# Patient Record
Sex: Female | Born: 1965
Health system: Southern US, Community
[De-identification: ages and names within clinical notes are randomized; demographics above are authoritative.]

## PROBLEM LIST (undated history)

## (undated) DIAGNOSIS — F419 Anxiety disorder, unspecified: Secondary | ICD-10-CM

## (undated) DIAGNOSIS — K219 Gastro-esophageal reflux disease without esophagitis: Secondary | ICD-10-CM

## (undated) DIAGNOSIS — J45909 Unspecified asthma, uncomplicated: Secondary | ICD-10-CM

## (undated) DIAGNOSIS — I341 Nonrheumatic mitral (valve) prolapse: Secondary | ICD-10-CM

## (undated) DIAGNOSIS — T7840XA Allergy, unspecified, initial encounter: Secondary | ICD-10-CM

## (undated) DIAGNOSIS — R011 Cardiac murmur, unspecified: Secondary | ICD-10-CM

## (undated) DIAGNOSIS — E079 Disorder of thyroid, unspecified: Secondary | ICD-10-CM

## (undated) DIAGNOSIS — I340 Nonrheumatic mitral (valve) insufficiency: Secondary | ICD-10-CM

## (undated) DIAGNOSIS — F32A Depression, unspecified: Secondary | ICD-10-CM

## (undated) DIAGNOSIS — I10 Essential (primary) hypertension: Secondary | ICD-10-CM

## (undated) DIAGNOSIS — M797 Fibromyalgia: Secondary | ICD-10-CM

## (undated) DIAGNOSIS — M1990 Unspecified osteoarthritis, unspecified site: Secondary | ICD-10-CM

## (undated) DIAGNOSIS — F329 Major depressive disorder, single episode, unspecified: Secondary | ICD-10-CM

## (undated) DIAGNOSIS — E785 Hyperlipidemia, unspecified: Secondary | ICD-10-CM

## (undated) DIAGNOSIS — I351 Nonrheumatic aortic (valve) insufficiency: Secondary | ICD-10-CM

## (undated) DIAGNOSIS — G4730 Sleep apnea, unspecified: Secondary | ICD-10-CM

## (undated) HISTORY — DX: Essential (primary) hypertension: I10

## (undated) HISTORY — PX: TUBAL LIGATION: SHX77

## (undated) HISTORY — DX: Gastro-esophageal reflux disease without esophagitis: K21.9

## (undated) HISTORY — DX: Major depressive disorder, single episode, unspecified: F32.9

## (undated) HISTORY — DX: Unspecified asthma, uncomplicated: J45.909

## (undated) HISTORY — DX: Depression, unspecified: F32.A

## (undated) HISTORY — PX: TRANSESOPHAGEAL ECHOCARDIOGRAM: SHX273

## (undated) HISTORY — DX: Nonrheumatic aortic (valve) insufficiency: I35.1

## (undated) HISTORY — DX: Disorder of thyroid, unspecified: E07.9

## (undated) HISTORY — DX: Allergy, unspecified, initial encounter: T78.40XA

## (undated) HISTORY — DX: Hyperlipidemia, unspecified: E78.5

## (undated) HISTORY — DX: Unspecified osteoarthritis, unspecified site: M19.90

## (undated) HISTORY — DX: Nonrheumatic mitral (valve) insufficiency: I34.0

## (undated) HISTORY — DX: Fibromyalgia: M79.7

## (undated) HISTORY — DX: Cardiac murmur, unspecified: R01.1

## (undated) HISTORY — PX: CHOLECYSTECTOMY: SHX55

## (undated) HISTORY — DX: Nonrheumatic mitral (valve) prolapse: I34.1

## (undated) HISTORY — DX: Sleep apnea, unspecified: G47.30

## (undated) HISTORY — DX: Anxiety disorder, unspecified: F41.9

---

## 1997-12-23 ENCOUNTER — Ambulatory Visit (HOSPITAL_COMMUNITY): Admission: RE | Admit: 1997-12-23 | Discharge: 1997-12-23 | Payer: Self-pay | Admitting: Otolaryngology

## 1998-01-06 ENCOUNTER — Inpatient Hospital Stay (HOSPITAL_COMMUNITY): Admission: EM | Admit: 1998-01-06 | Discharge: 1998-01-08 | Payer: Self-pay | Admitting: Internal Medicine

## 1998-01-07 ENCOUNTER — Encounter: Payer: Self-pay | Admitting: Internal Medicine

## 1998-02-09 ENCOUNTER — Other Ambulatory Visit: Admission: RE | Admit: 1998-02-09 | Discharge: 1998-02-09 | Payer: Self-pay | Admitting: Internal Medicine

## 1998-06-18 ENCOUNTER — Encounter: Payer: Self-pay | Admitting: Emergency Medicine

## 1998-06-18 ENCOUNTER — Emergency Department (HOSPITAL_COMMUNITY): Admission: EM | Admit: 1998-06-18 | Discharge: 1998-06-18 | Payer: Self-pay | Admitting: Emergency Medicine

## 1998-08-24 ENCOUNTER — Encounter: Admission: RE | Admit: 1998-08-24 | Discharge: 1998-09-02 | Payer: Self-pay | Admitting: Internal Medicine

## 1999-05-20 ENCOUNTER — Ambulatory Visit (HOSPITAL_COMMUNITY): Admission: RE | Admit: 1999-05-20 | Discharge: 1999-05-20 | Payer: Self-pay | Admitting: Neurosurgery

## 2000-04-04 ENCOUNTER — Other Ambulatory Visit: Admission: RE | Admit: 2000-04-04 | Discharge: 2000-04-04 | Payer: Self-pay | Admitting: Internal Medicine

## 2000-11-05 ENCOUNTER — Encounter: Payer: Self-pay | Admitting: Internal Medicine

## 2000-11-05 ENCOUNTER — Emergency Department: Admission: EM | Admit: 2000-11-05 | Discharge: 2000-11-05 | Payer: Self-pay | Admitting: Emergency Medicine

## 2001-05-03 ENCOUNTER — Other Ambulatory Visit: Admission: RE | Admit: 2001-05-03 | Discharge: 2001-05-03 | Payer: Self-pay | Admitting: Internal Medicine

## 2001-06-14 ENCOUNTER — Ambulatory Visit (HOSPITAL_COMMUNITY): Admission: RE | Admit: 2001-06-14 | Discharge: 2001-06-14 | Payer: Self-pay | Admitting: Family Medicine

## 2001-06-14 ENCOUNTER — Encounter: Payer: Self-pay | Admitting: Family Medicine

## 2001-06-26 ENCOUNTER — Encounter: Admission: RE | Admit: 2001-06-26 | Discharge: 2001-07-09 | Payer: Self-pay | Admitting: Family Medicine

## 2002-04-18 ENCOUNTER — Encounter: Payer: Self-pay | Admitting: Neurosurgery

## 2002-04-18 ENCOUNTER — Ambulatory Visit (HOSPITAL_COMMUNITY): Admission: RE | Admit: 2002-04-18 | Discharge: 2002-04-18 | Payer: Self-pay | Admitting: Neurosurgery

## 2002-07-23 ENCOUNTER — Encounter: Payer: Self-pay | Admitting: Neurosurgery

## 2002-07-23 ENCOUNTER — Encounter: Payer: Self-pay | Admitting: Radiology

## 2002-07-23 ENCOUNTER — Encounter: Admission: RE | Admit: 2002-07-23 | Discharge: 2002-07-23 | Payer: Self-pay | Admitting: Neurosurgery

## 2003-07-07 ENCOUNTER — Ambulatory Visit (HOSPITAL_COMMUNITY): Admission: RE | Admit: 2003-07-07 | Discharge: 2003-07-07 | Payer: Self-pay | Admitting: Urology

## 2003-07-07 ENCOUNTER — Ambulatory Visit (HOSPITAL_BASED_OUTPATIENT_CLINIC_OR_DEPARTMENT_OTHER): Admission: RE | Admit: 2003-07-07 | Discharge: 2003-07-07 | Payer: Self-pay | Admitting: Urology

## 2003-07-31 ENCOUNTER — Ambulatory Visit (HOSPITAL_BASED_OUTPATIENT_CLINIC_OR_DEPARTMENT_OTHER): Admission: RE | Admit: 2003-07-31 | Discharge: 2003-07-31 | Payer: Self-pay | Admitting: Internal Medicine

## 2003-07-31 ENCOUNTER — Encounter: Payer: Self-pay | Admitting: Internal Medicine

## 2004-05-05 ENCOUNTER — Emergency Department (HOSPITAL_COMMUNITY): Admission: EM | Admit: 2004-05-05 | Discharge: 2004-05-05 | Payer: Self-pay | Admitting: Family Medicine

## 2004-08-16 LAB — CONVERTED CEMR LAB: Pap Smear: NORMAL

## 2005-06-12 ENCOUNTER — Ambulatory Visit (HOSPITAL_COMMUNITY): Admission: RE | Admit: 2005-06-12 | Discharge: 2005-06-12 | Payer: Self-pay | Admitting: Family Medicine

## 2005-06-12 ENCOUNTER — Emergency Department (HOSPITAL_COMMUNITY): Admission: EM | Admit: 2005-06-12 | Discharge: 2005-06-12 | Payer: Self-pay | Admitting: Family Medicine

## 2005-08-14 ENCOUNTER — Emergency Department (HOSPITAL_COMMUNITY): Admission: EM | Admit: 2005-08-14 | Discharge: 2005-08-14 | Payer: Self-pay | Admitting: Emergency Medicine

## 2005-12-30 ENCOUNTER — Ambulatory Visit (HOSPITAL_COMMUNITY): Admission: RE | Admit: 2005-12-30 | Discharge: 2005-12-30 | Payer: Self-pay | Admitting: Family Medicine

## 2006-01-30 ENCOUNTER — Encounter (INDEPENDENT_AMBULATORY_CARE_PROVIDER_SITE_OTHER): Payer: Self-pay | Admitting: Specialist

## 2006-01-31 ENCOUNTER — Inpatient Hospital Stay (HOSPITAL_COMMUNITY): Admission: RE | Admit: 2006-01-31 | Discharge: 2006-01-31 | Payer: Self-pay | Admitting: General Surgery

## 2006-05-12 ENCOUNTER — Ambulatory Visit: Payer: Self-pay | Admitting: *Deleted

## 2006-05-12 ENCOUNTER — Encounter (INDEPENDENT_AMBULATORY_CARE_PROVIDER_SITE_OTHER): Payer: Self-pay | Admitting: *Deleted

## 2006-05-12 ENCOUNTER — Ambulatory Visit (HOSPITAL_COMMUNITY): Admission: RE | Admit: 2006-05-12 | Discharge: 2006-05-12 | Payer: Self-pay | Admitting: Family Medicine

## 2007-03-24 ENCOUNTER — Emergency Department (HOSPITAL_COMMUNITY): Admission: EM | Admit: 2007-03-24 | Discharge: 2007-03-24 | Payer: Self-pay | Admitting: Family Medicine

## 2007-09-07 ENCOUNTER — Ambulatory Visit: Payer: Self-pay | Admitting: Internal Medicine

## 2007-09-07 DIAGNOSIS — R946 Abnormal results of thyroid function studies: Secondary | ICD-10-CM | POA: Insufficient documentation

## 2007-09-07 DIAGNOSIS — F3289 Other specified depressive episodes: Secondary | ICD-10-CM | POA: Insufficient documentation

## 2007-09-07 DIAGNOSIS — G43909 Migraine, unspecified, not intractable, without status migrainosus: Secondary | ICD-10-CM | POA: Insufficient documentation

## 2007-09-07 DIAGNOSIS — IMO0001 Reserved for inherently not codable concepts without codable children: Secondary | ICD-10-CM | POA: Insufficient documentation

## 2007-09-07 DIAGNOSIS — F329 Major depressive disorder, single episode, unspecified: Secondary | ICD-10-CM | POA: Insufficient documentation

## 2007-09-07 DIAGNOSIS — Z87442 Personal history of urinary calculi: Secondary | ICD-10-CM | POA: Insufficient documentation

## 2007-09-07 DIAGNOSIS — Q054 Unspecified spina bifida with hydrocephalus: Secondary | ICD-10-CM | POA: Insufficient documentation

## 2007-09-07 DIAGNOSIS — J45909 Unspecified asthma, uncomplicated: Secondary | ICD-10-CM | POA: Insufficient documentation

## 2007-09-07 DIAGNOSIS — I059 Rheumatic mitral valve disease, unspecified: Secondary | ICD-10-CM | POA: Insufficient documentation

## 2007-09-07 DIAGNOSIS — F988 Other specified behavioral and emotional disorders with onset usually occurring in childhood and adolescence: Secondary | ICD-10-CM | POA: Insufficient documentation

## 2007-09-07 DIAGNOSIS — J309 Allergic rhinitis, unspecified: Secondary | ICD-10-CM | POA: Insufficient documentation

## 2007-09-07 LAB — CONVERTED CEMR LAB
ALT: 12 units/L (ref 0–35)
AST: 17 units/L (ref 0–37)
Albumin: 4.5 g/dL (ref 3.5–5.2)
Alkaline Phosphatase: 61 units/L (ref 39–117)
BUN: 10 mg/dL (ref 6–23)
Bilirubin, Direct: 0.1 mg/dL (ref 0.0–0.3)
CO2: 23 meq/L (ref 19–32)
Calcium: 8.9 mg/dL (ref 8.4–10.5)
Chloride: 103 meq/L (ref 96–112)
Cholesterol: 186 mg/dL (ref 0–200)
Creatinine, Ser: 0.84 mg/dL (ref 0.40–1.20)
Glucose, Bld: 81 mg/dL (ref 70–99)
HDL: 72 mg/dL (ref >39)
Indirect Bilirubin: 0.4 mg/dL (ref 0.0–0.9)
LDL Cholesterol: 98 mg/dL (ref 0–99)
Potassium: 3.7 meq/L (ref 3.5–5.3)
Sodium: 139 meq/L (ref 135–145)
T4, Total: 8.8 ug/dL (ref 5.0–12.5)
TSH: 2.956 microintl units/mL (ref 0.350–4.50)
Thyroglobulin Ab: <30 (ref 0.0–60.0)
Total Bilirubin: 0.5 mg/dL (ref 0.3–1.2)
Total Protein: 6.9 g/dL (ref 6.0–8.3)
Triglycerides: 80 mg/dL (ref <150)
VLDL: 16 mg/dL (ref 0–40)

## 2007-09-18 ENCOUNTER — Encounter: Payer: Self-pay | Admitting: Internal Medicine

## 2007-09-26 ENCOUNTER — Ambulatory Visit: Payer: Self-pay | Admitting: Internal Medicine

## 2007-09-26 ENCOUNTER — Telehealth: Payer: Self-pay | Admitting: Internal Medicine

## 2007-09-26 LAB — CONVERTED CEMR LAB
Thyroglobulin Ab: 33.8 (ref 0.0–60.0)
Thyroperoxidase Ab SerPl-aCnc: <25 (ref 0.0–60.0)

## 2007-09-27 ENCOUNTER — Telehealth: Payer: Self-pay | Admitting: Internal Medicine

## 2007-10-19 ENCOUNTER — Ambulatory Visit: Payer: Self-pay | Admitting: Internal Medicine

## 2007-10-19 DIAGNOSIS — M546 Pain in thoracic spine: Secondary | ICD-10-CM | POA: Insufficient documentation

## 2007-12-17 ENCOUNTER — Ambulatory Visit: Payer: Self-pay | Admitting: Internal Medicine

## 2007-12-17 DIAGNOSIS — H9209 Otalgia, unspecified ear: Secondary | ICD-10-CM | POA: Insufficient documentation

## 2007-12-17 DIAGNOSIS — R5381 Other malaise: Secondary | ICD-10-CM | POA: Insufficient documentation

## 2007-12-17 DIAGNOSIS — R5383 Other fatigue: Secondary | ICD-10-CM

## 2007-12-17 LAB — CONVERTED CEMR LAB
Basophils Absolute: 0.2 10*3/uL — ABNORMAL HIGH (ref 0.0–0.1)
Basophils Relative: 3.7 % — ABNORMAL HIGH (ref 0.0–3.0)
Eosinophils Absolute: 0.2 10*3/uL (ref 0.0–0.7)
Eosinophils Relative: 3.4 % (ref 0.0–5.0)
Ferritin: 6.3 ng/mL — ABNORMAL LOW (ref 10.0–291.0)
HCT: 35.9 % — ABNORMAL LOW (ref 36.0–46.0)
Hemoglobin: 12.4 g/dL (ref 12.0–15.0)
Iron: 45 ug/dL (ref 42–145)
Lymphocytes Relative: 47.8 % — ABNORMAL HIGH (ref 12.0–46.0)
MCHC: 34.5 g/dL (ref 30.0–36.0)
MCV: 99.6 fL (ref 78.0–100.0)
Monocytes Absolute: 0.3 10*3/uL (ref 0.1–1.0)
Monocytes Relative: 6.5 % (ref 3.0–12.0)
Neutro Abs: 2 10*3/uL (ref 1.4–7.7)
Neutrophils Relative %: 38.6 % — ABNORMAL LOW (ref 43.0–77.0)
Platelets: 220 10*3/uL (ref 150–400)
RBC: 3.6 M/uL — ABNORMAL LOW (ref 3.87–5.11)
RDW: 12.2 % (ref 11.5–14.6)
Saturation Ratios: 12.3 % — ABNORMAL LOW (ref 20.0–50.0)
Transferrin: 261.3 mg/dL (ref >=212.0)
WBC: 5.3 10*3/uL (ref 4.5–10.5)

## 2008-03-25 ENCOUNTER — Encounter: Payer: Self-pay | Admitting: Internal Medicine

## 2008-03-26 ENCOUNTER — Encounter: Payer: Self-pay | Admitting: Internal Medicine

## 2008-04-21 ENCOUNTER — Ambulatory Visit: Payer: Self-pay | Admitting: Internal Medicine

## 2008-04-21 DIAGNOSIS — R1310 Dysphagia, unspecified: Secondary | ICD-10-CM | POA: Insufficient documentation

## 2008-04-21 DIAGNOSIS — K219 Gastro-esophageal reflux disease without esophagitis: Secondary | ICD-10-CM | POA: Insufficient documentation

## 2008-04-22 ENCOUNTER — Encounter: Admission: RE | Admit: 2008-04-22 | Discharge: 2008-04-22 | Payer: Self-pay | Admitting: Internal Medicine

## 2008-04-25 ENCOUNTER — Encounter: Payer: Self-pay | Admitting: Internal Medicine

## 2008-06-19 ENCOUNTER — Ambulatory Visit: Payer: Self-pay | Admitting: Internal Medicine

## 2008-06-19 DIAGNOSIS — K589 Irritable bowel syndrome without diarrhea: Secondary | ICD-10-CM | POA: Insufficient documentation

## 2008-06-24 ENCOUNTER — Encounter: Payer: Self-pay | Admitting: Internal Medicine

## 2008-07-08 ENCOUNTER — Encounter: Payer: Self-pay | Admitting: Internal Medicine

## 2008-09-10 ENCOUNTER — Encounter (INDEPENDENT_AMBULATORY_CARE_PROVIDER_SITE_OTHER): Payer: Self-pay | Admitting: Gastroenterology

## 2008-09-10 ENCOUNTER — Ambulatory Visit (HOSPITAL_COMMUNITY): Admission: RE | Admit: 2008-09-10 | Discharge: 2008-09-10 | Payer: Self-pay | Admitting: Gastroenterology

## 2008-09-10 ENCOUNTER — Encounter: Payer: Self-pay | Admitting: Internal Medicine

## 2008-10-16 ENCOUNTER — Encounter: Payer: Self-pay | Admitting: Internal Medicine

## 2008-10-20 ENCOUNTER — Ambulatory Visit: Payer: Self-pay | Admitting: Internal Medicine

## 2008-10-20 DIAGNOSIS — N920 Excessive and frequent menstruation with regular cycle: Secondary | ICD-10-CM | POA: Insufficient documentation

## 2008-10-21 ENCOUNTER — Ambulatory Visit (HOSPITAL_COMMUNITY): Admission: RE | Admit: 2008-10-21 | Discharge: 2008-10-21 | Payer: Self-pay | Admitting: Obstetrics and Gynecology

## 2008-10-21 ENCOUNTER — Encounter (HOSPITAL_COMMUNITY): Payer: Self-pay | Admitting: Obstetrics and Gynecology

## 2009-01-17 ENCOUNTER — Emergency Department (HOSPITAL_COMMUNITY): Admission: EM | Admit: 2009-01-17 | Discharge: 2009-01-17 | Payer: Self-pay | Admitting: Family Medicine

## 2009-03-31 ENCOUNTER — Ambulatory Visit (HOSPITAL_BASED_OUTPATIENT_CLINIC_OR_DEPARTMENT_OTHER): Admission: RE | Admit: 2009-03-31 | Discharge: 2009-03-31 | Payer: Self-pay | Admitting: Internal Medicine

## 2010-03-18 ENCOUNTER — Encounter
Admission: RE | Admit: 2010-03-18 | Discharge: 2010-03-18 | Payer: Self-pay | Source: Home / Self Care | Attending: Internal Medicine | Admitting: Internal Medicine

## 2010-05-10 ENCOUNTER — Other Ambulatory Visit: Payer: Self-pay | Admitting: Unknown Physician Specialty

## 2010-05-10 ENCOUNTER — Ambulatory Visit
Admission: RE | Admit: 2010-05-10 | Discharge: 2010-05-10 | Disposition: A | Discharge status: Home / Self Care | Payer: Commercial Managed Care - PPO | Source: Ambulatory Visit | Attending: Unknown Physician Specialty | Admitting: Unknown Physician Specialty

## 2010-05-10 DIAGNOSIS — M542 Cervicalgia: Secondary | ICD-10-CM

## 2010-06-05 LAB — CBC
HCT: 36.1 % (ref 36.0–46.0)
Hemoglobin: 12 g/dL (ref 12.0–15.0)
MCHC: 33.2 g/dL (ref 30.0–36.0)
MCV: 89.8 fL (ref 78.0–100.0)
RBC: 4.02 MIL/uL (ref 3.87–5.11)
RDW: 15.7 % — ABNORMAL HIGH (ref 11.5–15.5)

## 2010-06-05 LAB — COMPREHENSIVE METABOLIC PANEL
ALT: 25 U/L (ref 0–35)
BUN: 9 mg/dL (ref 6–23)
Calcium: 8.7 mg/dL (ref 8.4–10.5)
Chloride: 103 mEq/L (ref 96–112)
GFR calc Af Amer: >60 mL/min (ref >60)
Potassium: 3.5 mEq/L (ref 3.5–5.1)

## 2010-06-05 LAB — TYPE AND SCREEN: ABO/RH(D): A POS

## 2010-06-05 LAB — ABO/RH: ABO/RH(D): A POS

## 2010-07-13 NOTE — Op Note (Signed)
Bethany Baker, Bethany Baker                 ACCOUNT NO.:  [id number]   MEDICAL RECORD NO.:  [id number]          PATIENT TYPE:  AMB   LOCATION:  SDC                           FACILITY:  [facility]   PHYSICIAN:  Zelphia Cairo, MD    DATE OF BIRTH:  28-Apr-1965   DATE OF PROCEDURE:  10/21/2008  DATE OF DISCHARGE:                               OPERATIVE REPORT   PREOPERATIVE DIAGNOSES:  1. Irregular menses.  2. Menorrhagia.  3. Desires permanent sterility.   PROCEDURE:  Laparoscopic tubal ligation with Filshie clips,  hysteroscopy, D and C with NovaSure ablation.   SURGEON:  Zelphia Cairo, MD   ANESTHESIA:  General.   FINDINGS:  Normal-appearing pelvis.  Fluffy endometrial cavity with  polypoid-like masses.   SPECIMEN:  Endometrial curettings to pathology.   BLOOD LOSS:  Minimal.   COMPLICATIONS:  None.   CONDITION:  Stable to recovery room.   PROCEDURE:  Bethany Baker was taken to the operating room where general  anesthesia was obtained.  She was placed in the dorsal lithotomy  position using Allen stirrups.  She was prepped and draped in sterile  fashion, and a catheter was used to drain her bladder for 50 mL of clear  urine.  Speculum was placed in the vagina, and a single-tooth tenaculum  placed on the anterior lip of the cervix.  Hulka clamp was placed on the  cervix for uterine manipulation.  Single-tooth tenaculum and speculum  were removed, and our attention was turned to the abdomen.  A small  infraumbilical skin incision was made with a scalpel and extended to the  level of the fascia.  The fascia was grasped with a Kelly clamp and  incised bluntly using Mayo scissors.  Optical trocar was inserted under  direct visualization.  Once intraperitoneal placement was confirmed, CO2  was turned on and a survey of the abdomen and pelvis was performed.  Uterus, fallopian tubes, and ovaries appeared normal.  Right upper  quadrant appeared normal.  When looking directly beneath our site of  entry, there was no evidence of trauma or bleeding.  The uterus was then  manipulated to the right.  A Filshie clip was placed on the isthmus  portion of the fallopian tube.  This procedure was repeated on the left  fallopian tube.  Once bilateral Filshie clips were placed on the  fallopian tubes, all instruments were removed from the abdomen.  CO2 was  removed.  The fascia was reapproximated using 3-0 Vicryl in a figure-of-  eight stitch.  The skin was reapproximated using 3-0 Vicryl subcuticular  stitch and then Dermabond was applied.  Our attention was then turned to  the vagina.   Hulka clamp was removed.  Speculum was inserted into the vagina.  Of  note, she had a very narrow pelvic outlet requiring [place] to use a narrow  speculum.  Cervix was grasped with a tenaculum.  The cervix was serially  dilated using Pratt dilators.  The uterus sounded to 9 cm.  The cervix  measured 4 cm.  The hysteroscope was inserted, and a survey of the  uterus was performed.  She had a lot of fluffy endometrial tissue and  polypoid-like masses noted within the cavity.  Dara Lords could not be  visualized bilaterally because of this.  Hysteroscope was then removed,  and a gentle curetting was performed.  Specimen was placed on Telfa and  passed off to be sent to pathology.  The NovaSure device was then  inserted into the uterine cavity.  The cavity width measured 3.  The  NovaSure ablation was then performed using standard manufacture  guidelines.  She tolerated the procedure well.  Once the cycle was  complete, the NovaSure device was removed.  Tenaculum was removed.  Cervix was hemostatic.  Speculum was removed.  She was extubated and  taken to the recovery room in stable condition.  Sponge, lap, needle,  and instrument counts were correct x2.      Zelphia Cairo, MD  Electronically Signed     GA/MEDQ  D:  10/21/2008  T:  10/21/2008  Job:  [id number]

## 2010-07-13 NOTE — Op Note (Signed)
Bethany Baker, Bethany Baker                 ACCOUNT NO.:  [id number]   MEDICAL RECORD NO.:  [id number]          PATIENT TYPE:  AMB   LOCATION:  ENDO                         [facility]ACILITY:  [facility]   PHYSICIAN:  Anselmo Rod, M.D.  DATE OF BIRTH:  October 04, 1965   DATE OF PROCEDURE:  09/10/2008  DATE OF DISCHARGE:  09/10/2008                               OPERATIVE REPORT   PROCEDURE PERFORMED:  Colonoscopy with multiple cold biopsies.   ENDOSCOPIST:  Anselmo Rod, M.D.   INSTRUMENT USED:  Pentax video colonoscope.   INDICATIONS FOR PROCEDURE:  A 45 year old white female with a history of  rectal bleeding and abdominal pain undergoing colonoscopy.  The patient  has had diarrhea intermittently, rule out colitis.  The patient's  brother has a history of lymphocytic colitis.   PREPROCEDURE PREPARATION:  Informed consent was procured from the  patient.  The patient fasted for 4 hours prior to the procedure and  prepped with MoviPrep the night prior to the procedure.  The morning of  the procedure risks and benefits of the procedure including a 10% miss  rate of cancer and polyp were discussed with the patient as well.   PREPROCEDURE PHYSICAL:  VITAL SIGNS:  The patient had stable vital  signs.  NECK:  Supple.  CHEST:  Clear to auscultation.  CARDIOVASCULAR:  S1, S2 regular.  ABDOMEN:  Soft with normal bowel sounds.   DESCRIPTION OF PROCEDURE:  The patient was placed in the left lateral  decubitus position and sedated with an additional 2 mg of Versed given  intravenously in slow incremental doses. Once the patient was adequately  sedated and maintained on low-flow oxygen and continuous cardiac  monitoring, the Pentax video colonoscope was advanced from the rectum to  the cecum. The appendiceal orifice and ileocecal valve were clearly  visualized and photographed.  The entire colonic mucosa appeared healthy  with a normal vascular pattern. No masses, polyps, erosions, ulcerations  or  diverticula were noted.  Retroflexion in the rectum revealed no  abnormalities.  The terminal ileum appeared healthy. Multiple random  colonic biopsies were done to rule out microscopic versus lymphocytic  colitis.  The patient tolerated the procedure well without immediate  complications.   IMPRESSION:  Normal colonoscopy up to the terminal ileum.  Multiple  random colonic biopsies done to rule out microscopic colitis.   RECOMMENDATIONS:  1. Await pathology results.  2. Avoid all nonsteroidals for now.  3. Avoid artificial sweeteners like Splenda, Equal, etc.  4. Outpatient follow-up as need arises in the future.  5. Repeat colonoscopy in the next 10 years.  If the patient has any      abnormal symptoms in the interim she should contact the office      immediately for further recommendations.      Anselmo Rod, M.D.  Electronically Signed     JNM/MEDQ  D:  09/11/2008  T:  09/11/2008  Job:  [id number]   cc:   Barbette Hair. [place], DO  [address] [place], [place] [address]   Dr. Rebbeca Paul

## 2010-07-13 NOTE — Op Note (Signed)
Bethany Baker, Bethany Baker                 ACCOUNT NO.:  [id number]   MEDICAL RECORD NO.:  [id number]          PATIENT TYPE:  AMB   LOCATION:  ENDO                         [facility]ACILITY:  [facility]   PHYSICIAN:  Anselmo Rod, M.D.  DATE OF BIRTH:  February 25, 1966   DATE OF PROCEDURE:  09/10/2008  DATE OF DISCHARGE:  09/10/2008                               OPERATIVE REPORT   PROCEDURE:  Esophagogastroduodenoscopy.   ENDOSCOPIST:  Anselmo Rod, M.D.   INSTRUMENT USED:  Pentax video panendoscope.   INDICATIONS FOR PROCEDURE:  A 45 year old white female with a history of  reflux, abdominal pain and nausea, rule out peptic ulcer disease,  esophagitis, gastritis, etc.   PREPROCEDURE PREPARATION:  Informed consent was procured from the  patient.  The patient fasted for 4 hours prior to the procedure.  The  risks and benefits of the procedure were discussed with the patient in  detail.   PREPROCEDURE PHYSICAL:  VITAL SIGNS:  Patient with stable vital signs.  NECK:  Supple.  CHEST:  Clear to auscultation.  CARDIOVASCULAR:  S1-S2 regular.  ABDOMEN:  Soft with normal bowel sounds.   DESCRIPTION OF PROCEDURE:  The patient was placed in the left lateral  decubitus position and sedated with 100 mcg of fentanyl and 8 mg of  Versed given intravenously in slow incremental doses.  Once the patient  was adequately sedated and maintained on low flow oxygen and continuous  cardiac monitoring, the Pentax video panendoscope was advanced through  the mouthpiece through the tongue into the esophagus under direct  vision.  The entire esophagus was widely patent with no evidence of  ring, stricture, masses, esophagitis or Barrett's mucosa.  The scope was  then advanced into the stomach.  Mild antral gastritis was noted.  The  rest of the gastric mucosa appeared healthy.  The proximal small bowel  appeared normal, there was no obvious obstruction.  The patient  tolerated the procedure well without any  complications.   IMPRESSION:  Mild antral gastritis, otherwise normal  esophagogastroduodenoscopy.   RECOMMENDATIONS:  1. Continue to follow antireflux measures.  2. PPI as needed.  3. Proceed with a colonoscopy at this time.  4. Avoid all nonsteroidals.  5. Further recommendations to be made after the colonoscopy has been      done.      Anselmo Rod, M.D.  Electronically Signed     JNM/MEDQ  D:  09/11/2008  T:  09/11/2008  Job:  [id number]   cc:   Zelphia Cairo, MD  Fax: [phone]   Ethelene Hal, MD

## 2010-07-16 NOTE — Discharge Summary (Signed)
Bethany Baker, Bethany Baker                 ACCOUNT NO.:  [id number]   MEDICAL RECORD NO.:  [id number]          PATIENT TYPE:  INP   LOCATION:  5742                         FACILITY:  [facility]   PHYSICIAN:  Gabrielle Dare. Janee Morn, M.D.DATE OF BIRTH:  [birth year]   DATE OF ADMISSION:  01/30/2006  DATE OF DISCHARGE:  01/31/2006                               DISCHARGE SUMMARY   DISCHARGE DIAGNOSES:  1. Biliary colic.  2. Status post laparoscopic cholecystectomy with intraoperative      cholangiogram.   HISTORY OF PRESENT ILLNESS:  The patient is a 45 year old female who  presented for elective cholecystectomy.  She had also planned to stay  for transesophageal echocardiogram in the hospital on postoperative day  1.   HOSPITAL COURSE:  The patient underwent an uncomplicated laparoscopic  cholecystectomy with intraoperative cholangiogram.  Postoperatively, she  remained afebrile and hemodynamically stable.  She underwent TEE by Dr.  Jenne Campus on postoperative day 1.  She was stable following that procedure  and went home late that afternoon.  There were no further complications.   DISCHARGE DIET:  Lowfat.   DISCHARGE ACTIVITY:  No lifting.   DISCHARGE MEDICATIONS:  1. She is to continued taking Prilosec 20 mg p.o. daily.  2. Tums and Rolaids are as needed.  3. Wellbutrin XL 300 mg daily.  4. Benazepril 5 mg p.o. daily.  5. Ketorolac 10 mg p.o. q.8 h. p.r.n.  6. Seroquel 50 mg p.o. q.p.m.Marland Kitchen  7. Oxybutynin patches previously.  8. Demerol 50 mg p.o. q.4-6 h. p.r.n. pain.   FOLLOWUP:  With myself in 2-3 weeks.      Gabrielle Dare Janee Morn, M.D.  Electronically Signed     BET/MEDQ  D:  03/09/2006  T:  03/09/2006  Job:  [id number]

## 2010-07-16 NOTE — Op Note (Signed)
NAMEDAVINE, SWENEY                           ACCOUNT NO.:  [id number]   MEDICAL RECORD NO.:  [id number]                   PATIENT TYPE:  AMB   LOCATION:  NESC                                 FACILITY:  [facility]   PHYSICIAN:  Lindaann Slough, M.D.               DATE OF BIRTH:  12-10-1965   DATE OF PROCEDURE:  07/07/2003  DATE OF DISCHARGE:                                 OPERATIVE REPORT   PREOPERATIVE DIAGNOSES:  Left ureteral stone.   POSTOPERATIVE DIAGNOSES:  Left ureteral stone.   PROCEDURE:  Cystoscopy, stone extraction, retrograde pyelogram, ureteroscopy  and insertion of double J catheter.   SURGEON:  Lindaann Slough, M.D.   ANESTHESIA:  General.   INDICATIONS FOR PROCEDURE:  The patient is a 45 year old female who had been  complaining of severe left flank pain.  She was seen in the emergency room  on Jun 30, 2003.  A CT scan of the abdomen and pelvis showed a 1 mm to 2 mm  stone at the left distal ureter with hydronephrosis.  She was treated with  analgesics; however, she continued to complain of pain and she wanted ot  have the stone removed.  She is scheduled today for procedure.   DESCRIPTION OF PROCEDURE:  Under general anesthesia, the patient was prepped  and draped and placed in the dorsal lithotomy position.  A #22 Wappler  cystoscope was inserted in the bladder. There is marked edema at the left  ureteral orifice and a small stone is seen at the ureteral orifice.  There  is no tumor in the bladder. The right ureteral orifice is in normal position  and shape.  The stone was grasped with three prong forceps and the stone was  extracted.  Then an open end catheter was passed through the cystoscope and  into the left ureteral orifice and a Glidewire was passed through the open  end catheter into the renal pelvis.  Then the Glidewire was removed,  contrast was then injected through the open end catheter. There was some  extravasation at the upper ureter.  A guidewire  was then passed through the  open end catheter and advanced into the renal pelvis, the open end catheter  was removed.  The bladder was then emptied and the cystoscope removed. A #6  French rigid ureteroscope was then passed in the ureter and advanced all the  way up in the upper ureter without difficulty.  There was no evidence of  stone fragment in the ureter. The ureteroscope was then removed, the  guidewire was then back loaded into the cystoscope and a #6 [place] - 26  double J catheter was passed over the guidewire. The proximal curl of the  double J catheter is in the collecting system. The distal curl is in the  bladder. The bladder was then emptied and the cystoscope and guidewire were  removed.   The patient tolerated  the procedure well and left the OR in satisfactory  condition to post anesthesia care unit.                                               Lindaann Slough, M.D.    MN/MEDQ  D:  07/07/2003  T:  07/07/2003  Job:  [id number]

## 2010-07-16 NOTE — Op Note (Signed)
Bethany Baker, Bethany Baker              ACCOUNT NO.:  [id number]   MEDICAL RECORD NO.:  [id number]          PATIENT TYPE:  OIB   LOCATION:  5742                         FACILITY:  [facility]   PHYSICIAN:  Gabrielle Dare. Janee Morn, M.D.DATE OF BIRTH:  05-Sep-1965   DATE OF PROCEDURE:  01/30/2006  DATE OF DISCHARGE:                               OPERATIVE REPORT   PREOPERATIVE DIAGNOSIS:  Biliary colic.   POSTOPERATIVE DIAGNOSIS:  Biliary colic.   PROCEDURE:  Laparoscopic cholecystectomy with intraoperative  cholangiogram.   SURGEON:  Violeta Gelinas, M.D.   ANESTHESIA:  General.   HISTORY OF PRESENT ILLNESS:  I evaluated Bethany Baker in my office for  episodic right upper quadrant pain.  Ultrasound had previously been done  which had a positive Murphy's sign and she presents today for elective  cholecystectomy.   PROCEDURE IN DETAIL:  Informed consent was obtained.  The patient  received intravenous antibiotics.  She is brought to the operating room.  General anesthesia was administered.  Her abdomen was prepped and draped  in sterile fashion.  Infraumbilical region was infiltrated with 0.25%  Marcaine with epinephrine.  Infraumbilical incision was made.  Subcutaneous tissues were dissected down revealing the anterior fascia.  This was divided sharply along the midline.  The peritoneal cavity was  then entered under direct vision without difficulty.  The 0 Vicryl  pursestring suture was placed around the fascial opening and the Hasson  trocar was inserted into the abdomen.  The abdomen was insufflated with  carbon dioxide in standard fashion.  Under direct vision an 11 mm  epigastric and two 5-mm lateral ports were placed.  Quarter percent  Marcaine with epinephrine was injected all port sites.  The dome of the  gallbladder was then retracted superior laterally.  The infundibulum was  retracted inferomedially.  Dissection began laterally and progressed  medially easily identifying the cystic duct  and the cystic artery.  The  dissection was continued until a large window was made between the  cystic duct infundibulum and the liver.  The cystic artery was also  dissected out.  Two clips were placed proximally on the cystic artery.  One was placed distally and it was divided.  A clip was placed on the  infundibulocystic duct junction and a small nick was made in the cystic  duct.  Reddick cholangiogram catheter was inserted.  Intraoperative  cholangiogram was obtained demonstrating no common bile duct filling  defects, good flow of contrast was noted into the duodenum.  The  cholangiogram catheter was removed.  Three clips placed proximally on  the cystic duct and it was divided.  The gallbladder was taken off the  liver bed with Bovie cautery getting excellent hemostasis along the way.  The gallbladder was placed in EndoCatch bag and removed from the abdomen  via the infraumbilical port site.  The liver bed was copiously  irrigated.  Meticulous hemostasis was assured.  The irrigation fluid was  evacuated and returned clear.  The infraumbilical fascia was closed by  tying the 0 Vicryl pursestring suture under direct vision with care not  to trap any  intra-abdominal contents.  The ports removed under direct  vision.  The pneumoperitoneum was released.  The skin of each wound was  copiously irrigated.  Some additional local anesthetic was injected and  the skin of each wound  was closed with running 4-0 Vicryl subcuticular stitch.  Sponge, needle  and instrument counts were correct.  Benzoin, Steri-Strips and sterile  dressings were applied.  The patient tolerated the procedure well  without apparent complication and was taken to recovery room in stable  condition.      Gabrielle Dare Janee Morn, M.D.  Electronically Signed     BET/MEDQ  D:  01/30/2006  T:  01/30/2006  Job:  [id number]   cc:   Dr. __________

## 2010-07-16 NOTE — Op Note (Signed)
NAMECHEREESE, CILENTO                 ACCOUNT NO.:  [id number]   MEDICAL RECORD NO.:  [id number]          PATIENT TYPE:  INP   LOCATION:  5742                         FACILITY:  [facility]   PHYSICIAN:  Darlin Priestly, MD  DATE OF BIRTH:  10-29-1965   DATE OF PROCEDURE:  01/31/2006  DATE OF DISCHARGE:  01/31/2006                               OPERATIVE REPORT   PROCEDURE:  Transesophageal echocardiogram.   ATTENDING:  Dr. Lenise Herald.   COMPLICATIONS:  None.   INDICATION:  Ms. 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  She was recently  diagnosed with cholecystitis and underwent cholecystectomy.  While  hospitalized, she is now scheduled to undergo transesophageal  echocardiogram to further evaluate her aortic valve.   DESCRIPTION OF OPERATION:  After obtaining informed written consent, the  patient brought to the endoscopy suite in a fasting state.  She  underwent successful uncomplicated transesophageal echocardiogram.   The left ventricle was normal in size and function.  Estimated EF of  60%.   There is mildly thickened aortic valve leaflets.  The aortic valve is  trileaflet.  There is no evidence of significant aortic stenosis.  There  is mild to moderate aortic insufficiency.   The mitral valve leaflets were mildly thickened with mild prolapse in  the anterior mitral valve leaflet.  There is associated mild mitral  regurgitation.   Structure normal of the tricuspid valve with trivial to mild tricuspid  regurgitation.   Structure normal of pulmonic valve with mild pulmonic regurgitation.   No evidence of intracardiac mass or thrombus noted.   No evidence of PFO by color or contrast echo.   Normal descending thoracic aorta.   CONCLUSIONS:  Successful transesophageal echocardiogram with findings  noted above.  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      Darlin Priestly, MD  Electronically Signed     RHM/MEDQ  D:  01/31/2006  T:  02/01/2006  Job:  [id number]

## 2010-11-19 LAB — I-STAT 8, (EC8 V) (CONVERTED LAB)
Bicarbonate: 25.4 — ABNORMAL HIGH
HCT: 42
Hemoglobin: 14.3
Operator id: 247071
TCO2: 26
pCO2, Ven: 37.5 — ABNORMAL LOW

## 2010-11-19 LAB — POCT I-STAT CREATININE
Creatinine, Ser: 0.9
Operator id: 247071

## 2011-01-26 ENCOUNTER — Other Ambulatory Visit: Payer: Self-pay | Admitting: Internal Medicine

## 2011-01-26 DIAGNOSIS — R221 Localized swelling, mass and lump, neck: Secondary | ICD-10-CM

## 2011-01-27 ENCOUNTER — Ambulatory Visit
Admission: RE | Admit: 2011-01-27 | Discharge: 2011-01-27 | Disposition: A | Discharge status: Home / Self Care | Payer: Commercial Managed Care - PPO | Source: Ambulatory Visit | Attending: Internal Medicine | Admitting: Internal Medicine

## 2011-01-27 DIAGNOSIS — R221 Localized swelling, mass and lump, neck: Secondary | ICD-10-CM

## 2011-08-09 ENCOUNTER — Ambulatory Visit (HOSPITAL_COMMUNITY): Admission: RE | Admit: 2011-08-09 | Payer: Commercial Managed Care - PPO | Source: Ambulatory Visit

## 2011-08-23 ENCOUNTER — Ambulatory Visit (HOSPITAL_COMMUNITY)
Admission: RE | Admit: 2011-08-23 | Discharge: 2011-08-23 | Disposition: A | Discharge status: Home / Self Care | Payer: 59 | Source: Ambulatory Visit | Attending: Cardiovascular Disease | Admitting: Cardiovascular Disease

## 2011-08-23 DIAGNOSIS — I059 Rheumatic mitral valve disease, unspecified: Secondary | ICD-10-CM | POA: Insufficient documentation

## 2011-08-23 DIAGNOSIS — I341 Nonrheumatic mitral (valve) prolapse: Secondary | ICD-10-CM

## 2011-08-23 NOTE — Progress Notes (Signed)
*  PRELIMINARY RESULTS* Echocardiogram 2D Echocardiogram has been performed.  Bethany Baker 08/23/2011, 3:06 PM

## 2012-04-14 ENCOUNTER — Other Ambulatory Visit: Payer: Self-pay

## 2013-01-03 ENCOUNTER — Other Ambulatory Visit: Payer: Self-pay

## 2013-07-20 ENCOUNTER — Emergency Department (INDEPENDENT_AMBULATORY_CARE_PROVIDER_SITE_OTHER): Payer: 59

## 2013-07-20 ENCOUNTER — Encounter (HOSPITAL_COMMUNITY): Payer: Self-pay | Admitting: Emergency Medicine

## 2013-07-20 ENCOUNTER — Emergency Department (HOSPITAL_COMMUNITY)
Admission: EM | Admit: 2013-07-20 | Discharge: 2013-07-20 | Disposition: A | Discharge status: Home / Self Care | Payer: 59 | Source: Home / Self Care

## 2013-07-20 DIAGNOSIS — J309 Allergic rhinitis, unspecified: Secondary | ICD-10-CM

## 2013-07-20 DIAGNOSIS — R059 Cough, unspecified: Secondary | ICD-10-CM

## 2013-07-20 DIAGNOSIS — J9801 Acute bronchospasm: Secondary | ICD-10-CM

## 2013-07-20 DIAGNOSIS — R05 Cough: Secondary | ICD-10-CM

## 2013-07-20 MED ORDER — IPRATROPIUM-ALBUTEROL 0.5-2.5 (3) MG/3ML IN SOLN
3.0000 mL | Freq: Once | RESPIRATORY_TRACT | Status: AC
  Administered 2013-07-20: 3 mL via RESPIRATORY_TRACT

## 2013-07-20 MED ORDER — IPRATROPIUM-ALBUTEROL 0.5-2.5 (3) MG/3ML IN SOLN
RESPIRATORY_TRACT | Status: AC
  Filled 2013-07-20: qty 3

## 2013-07-20 NOTE — Discharge Instructions (Signed)
Allergic Rhinitis °Allergic rhinitis is when the mucous membranes in the nose respond to allergens. Allergens are particles in the air that cause your body to have an allergic reaction. This causes you to release allergic antibodies. Through a chain of events, these eventually cause you to release histamine into the blood stream. Although meant to protect the body, it is this release of histamine that causes your discomfort, such as frequent sneezing, congestion, and an itchy, runny nose.  °CAUSES  °Seasonal allergic rhinitis (hay fever) is caused by pollen allergens that may come from grasses, trees, and weeds. Year-round allergic rhinitis (perennial allergic rhinitis) is caused by allergens such as house dust mites, pet dander, and mold spores.  °SYMPTOMS  °· Nasal stuffiness (congestion). °· Itchy, runny nose with sneezing and tearing of the eyes. °DIAGNOSIS  °Your health care provider can help you determine the allergen or allergens that trigger your symptoms. If you and your health care provider are unable to determine the allergen, skin or blood testing may be used. °TREATMENT  °Allergic Rhinitis does not have a cure, but it can be controlled by: °· Medicines and allergy shots (immunotherapy). °· Avoiding the allergen. °Hay fever may often be treated with antihistamines in pill or nasal spray forms. Antihistamines block the effects of histamine. There are over-the-counter medicines that may help with nasal congestion and swelling around the eyes. Check with your health care provider before taking or giving this medicine.  °If avoiding the allergen or the medicine prescribed do not work, there are many new medicines your health care provider can prescribe. Stronger medicine may be used if initial measures are ineffective. Desensitizing injections can be used if medicine and avoidance does not work. Desensitization is when a patient is given ongoing shots until the body becomes less sensitive to the allergen.  Make sure you follow up with your health care provider if problems continue. °HOME CARE INSTRUCTIONS °It is not possible to completely avoid allergens, but you can reduce your symptoms by taking steps to limit your exposure to them. It helps to know exactly what you are allergic to so that you can avoid your specific triggers. °SEEK MEDICAL CARE IF:  °· You have a fever. °· You develop a cough that does not stop easily (persistent). °· You have shortness of breath. °· You start wheezing. °· Symptoms interfere with normal daily activities. °Document Released: 11/09/2000 Document Revised: 12/05/2012 Document Reviewed: 10/22/2012 °Exit[employer]® Patient Information ©2014 [employer]. ° °Bronchospasm, Adult °A bronchospasm is a spasm or tightening of the airways going into the lungs. During a bronchospasm breathing becomes more difficult because the airways get smaller. When this happens there can be coughing, a whistling sound when breathing (wheezing), and difficulty breathing. Bronchospasm is often associated with asthma, but not all patients who experience a bronchospasm have asthma. °CAUSES  °A bronchospasm is caused by inflammation or irritation of the airways. The inflammation or irritation may be triggered by:  °· Allergies (such as to animals, pollen, food, or mold). Allergens that cause bronchospasm may cause wheezing immediately after exposure or many hours later.   °· Infection. Viral infections are believed to be the most common cause of bronchospasm.   °· Exercise.   °· Irritants (such as pollution, cigarette smoke, strong odors, aerosol sprays, and paint fumes).   °· Weather changes. Winds increase molds and pollens in the air. Rain refreshes the air by washing irritants out. Cold air may cause inflammation.   °· Stress and emotional upset.   °SIGNS AND SYMPTOMS  °· Wheezing.   °·   Excessive nighttime coughing.   Frequent or severe coughing with a simple cold.   Chest tightness.   Shortness of  breath.  DIAGNOSIS  Bronchospasm is usually diagnosed through a history and physical exam. Tests, such as chest X-rays, are sometimes done to look for other conditions. TREATMENT   Inhaled medicines can be given to open up your airways and help you breathe. The medicines can be given using either an inhaler or a nebulizer machine.  Corticosteroid medicines may be given for severe bronchospasm, usually when it is associated with asthma. HOME CARE INSTRUCTIONS   Always have a plan prepared for seeking medical care. Know when to call your health care provider and local emergency services (911 in the [place] Know where you can access local emergency care.  Only take medicines as directed by your health care provider.  If you were prescribed an inhaler or nebulizer machine, ask your health care provider to explain how to use it correctly. Always use a spacer with your inhaler if you were given one.  It is necessary to remain calm during an attack. Try to relax and breathe more slowly.  Control your home environment in the following ways:   Change your heating and air conditioning filter at least once a month.   Limit your use of fireplaces and wood stoves.  Do not smoke and do not allow smoking in your home.   Avoid exposure to perfumes and fragrances.   Get rid of pests (such as roaches and mice) and their droppings.   Throw away plants if you see mold on them.   Keep your house clean and dust free.   Replace carpet with wood, tile, or vinyl flooring. Carpet can trap dander and dust.   Use allergy-proof pillows, mattress covers, and box spring covers.   Wash bed sheets and blankets every week in hot water and dry them in a dryer.   Use blankets that are made of polyester or cotton.   Wash hands frequently. SEEK MEDICAL CARE IF:   You have muscle aches.   You have chest pain.   The sputum changes from clear or white to yellow, green, gray, or bloody.   The  sputum you cough up gets thicker.   There are problems that may be related to the medicine you are given, such as a rash, itching, swelling, or trouble breathing.  SEEK IMMEDIATE MEDICAL CARE IF:   You have worsening wheezing and coughing even after taking your prescribed medicines.   You have increased difficulty breathing.   You develop severe chest pain. MAKE SURE YOU:   Understand these instructions.  Will watch your condition.  Will get help right away if you are not doing well or get worse. Document Released: 02/17/2003 Document Revised: 10/17/2012 Document Reviewed: 08/06/2012 [facility] Patient Information 2014 [place], [place].  Cough, Adult  A cough is a reflex that helps clear your throat and airways. It can help heal the body or may be a reaction to an irritated airway. A cough may only last 2 or 3 weeks (acute) or may last more than 8 weeks (chronic).  CAUSES Acute cough:  Viral or bacterial infections. Chronic cough:  Infections.  Allergies.  Asthma.  Post-nasal drip.  Smoking.  Heartburn or acid reflux.  Some medicines.  Chronic lung problems (COPD).  Cancer. SYMPTOMS   Cough.  Fever.  Chest pain.  Increased breathing rate.  High-pitched whistling sound when breathing (wheezing).  Colored mucus that you cough up (sputum). TREATMENT  A bacterial cough may be treated with antibiotic medicine.  A viral cough must run its course and will not respond to antibiotics.  Your caregiver may recommend other treatments if you have a chronic cough. HOME CARE INSTRUCTIONS   Only take over-the-counter or prescription medicines for pain, discomfort, or fever as directed by your caregiver. Use cough suppressants only as directed by your caregiver.  Use a cold steam vaporizer or humidifier in your bedroom or home to help loosen secretions.  Sleep in a semi-upright position if your cough is worse at night.  Rest as needed.  Stop smoking if you  smoke. SEEK IMMEDIATE MEDICAL CARE IF:   You have pus in your sputum.  Your cough starts to worsen.  You cannot control your cough with suppressants and are losing sleep.  You begin coughing up blood.  You have difficulty breathing.  You develop pain which is getting worse or is uncontrolled with medicine.  You have a fever. MAKE SURE YOU:   Understand these instructions.  Will watch your condition.  Will get help right away if you are not doing well or get worse. Document Released: 08/13/2010 Document Revised: 05/09/2011 Document Reviewed: 08/13/2010 [facility] Patient Information 2014 [place].  How to Use an Inhaler Using your inhaler correctly is very important. Good technique will make sure that the medicine reaches your lungs.  HOW TO USE AN INHALER: 1. Take the cap off the inhaler. 2. If this is the first time using your inhaler, you need to prime it. Shake the inhaler for 5 seconds. Release four puffs into the air, away from your face. Ask your doctor for help if you have questions. 3. Shake the inhaler for 5 seconds. 4. Turn the inhaler so the bottle is above the mouthpiece. 5. Put your pointer finger on top of the bottle. Your thumb holds the bottom of the inhaler. 6. Open your mouth. 7. Either hold the inhaler away from your mouth (the width of 2 fingers) or place your lips tightly around the mouthpiece. Ask your doctor which way to use your inhaler. 8. Breathe out as much air as possible. 9. Breathe in and push down on the bottle 1 time to release the medicine. You will feel the medicine go in your mouth and throat. 10. Continue to take a deep breath in very slowly. Try to fill your lungs. 11. After you have breathed in completely, hold your breath for 10 seconds. This will help the medicine to settle in your lungs. If you cannot hold your breath for 10 seconds, hold it for as long as you can before you breathe out. 12. Breathe out slowly, through pursed lips.  Whistling is an example of pursed lips. 13. If your doctor has told you to take more than 1 puff, wait at least 15 30 seconds between puffs. This will help you get the best results from your medicine. Do not use the inhaler more than your doctor tells you to. 14. Put the cap back on the inhaler. 15. Follow the directions from your doctor or from the inhaler package about cleaning the inhaler. If you use more than one inhaler, ask your doctor which inhalers to use and what order to use them in. Ask your doctor to help you figure out when you will need to refill your inhaler.  If you use a steroid inhaler, always rinse your mouth with water after your last puff, gargle and spit out the water. Do not swallow the water. GET  HELP IF:  The inhaler medicine only partially helps to stop wheezing or shortness of breath.  You are having trouble using your inhaler.  You have some increase in thick spit (phlegm). GET HELP RIGHT AWAY IF:  The inhaler medicine does not help your wheezing or shortness of breath or you have tightness in your chest.  You have dizziness, headaches, or fast heart rate.  You have chills, fever, or night sweats.  You have a large increase of thick spit, or your thick spit is bloody. MAKE SURE YOU:   Understand these instructions.  Will watch your condition.  Will get help right away if you are not doing well or get worse. Document Released: 11/24/2007 Document Revised: 12/05/2012 Document Reviewed: 09/13/2012 [facility] Patient Information 2014 [place], [place].

## 2013-07-20 NOTE — ED Notes (Signed)
Pt c/o persistent cough onset 8 days Seen by PCP on Monday and dx w/allergies Given Prednisone, doxycycline, ventolin and Singulair Tolerating meds well Sx also include vomiting due to cough, SOB, wheezing, chest d/c due cough Denies f/n/d Alert w/no signs of acute distress.

## 2013-07-20 NOTE — ED Provider Notes (Signed)
Medical screening examination/treatment/procedure(s) were performed by resident physician or non-physician practitioner and as supervising physician I was immediately available for consultation/collaboration.   Kaniah Rizzolo DOUGLAS MD.   Arjan Strohm D Furman Trentman, MD 07/20/13 2000 

## 2013-07-20 NOTE — ED Provider Notes (Signed)
CSN: [id number]     Arrival date & time 07/20/13  1117 History   First MD Initiated Contact with Patient 07/20/13 1319     Chief Complaint  Patient presents with  . Cough   (Consider location/radiation/quality/duration/timing/severity/associated sxs/prior Treatment) HPI Comments: 48 year old female with a cough for 10 days. He has a dry hacking cough. She saw her PCP 5 days ago. On that day she had temperature of 99.3. She states that she was diagnosed with allergies and treated with prednisone, doxycycline, albuterol and Singulair. She is unsure if she is using her albuterol correctly. She continues to have a persistent dry hacking cough. Denies current fever.   History reviewed. No pertinent past medical history. History reviewed. No pertinent past surgical history. No family history on file. History  Substance Use Topics  . Smoking status: Never Smoker   . Smokeless tobacco: Not on file  . Alcohol Use: No   OB History   Grav Para Term Preterm Abortions TAB SAB Ect Mult Living                 Review of Systems  Constitutional: Positive for activity change. Negative for fever.  HENT: Negative for congestion, postnasal drip, sinus pressure and sore throat.   Respiratory: Positive for cough. Negative for shortness of breath and wheezing.   Cardiovascular: Positive for chest pain.       Chest pain with cough only.  Genitourinary: Negative.   Neurological: Negative.     Allergies  Acetaminophen; Aspirin; and Ibuprofen  Home Medications   Prior to Admission medications   Medication Sig Start Date End Date Taking? Authorizing Provider  albuterol (PROVENTIL HFA;VENTOLIN HFA) 108 (90 BASE) MCG/ACT inhaler Inhale into the lungs every 6 (six) hours as needed for wheezing or shortness of breath.   Yes Historical Provider, MD  dextromethorphan (DELSYM) 30 MG/5ML liquid Take by mouth as needed for cough.   Yes Historical Provider, MD  doxycycline (DORYX) 100 MG EC tablet Take 100 mg  by mouth 2 (two) times daily.   Yes Historical Provider, MD  montelukast (SINGULAIR) 10 MG tablet Take 10 mg by mouth at bedtime.   Yes Historical Provider, MD  predniSONE (DELTASONE) 10 MG tablet Take 10 mg by mouth daily with breakfast.   Yes Historical Provider, MD   BP 150/100  Pulse 101  Temp(Src) 98.5 F (36.9 C) (Oral)  Resp 24  SpO2 97% Physical Exam  Nursing note and vitals reviewed. Constitutional: She is oriented to person, place, and time. She appears well-developed and well-nourished. No distress.  HENT:  Mouth/Throat: Oropharynx is clear and moist. No oropharyngeal exudate.  PE mildly red.  Eyes: Conjunctivae and EOM are normal.  Cardiovascular: Normal rate, regular rhythm and normal heart sounds.   Pulmonary/Chest: Effort normal. No respiratory distress.  Patient has coughing spasms that make it difficult to hear true expiratory breath sounds. Few wheezes are noted in the upper fields bilaterally. Good air movement with inspiration.  Lymphadenopathy:    She has no cervical adenopathy.  Neurological: She is alert and oriented to person, place, and time. She exhibits normal muscle tone.  Skin: Skin is warm and dry.  Psychiatric: She has a normal mood and affect.    ED Course  Procedures (including critical care time) Labs Review Labs Reviewed - No data to display  Imaging Review Dg Chest 2 View  07/20/2013   CLINICAL DATA:  Ten day history of cough, shortness of breath  EXAM: CHEST  2 VIEW  COMPARISON:  Prior chest x-ray 01/25/2006  FINDINGS: The lungs are clear and negative for focal airspace consolidation, pulmonary edema or suspicious pulmonary nodule. No pleural effusion or pneumothorax. Cardiac and mediastinal contours are within normal limits. No acute fracture or lytic or blastic osseous lesions. The visualized upper abdominal bowel gas pattern is unremarkable. Surgical clips in the right upper quadrant consistent with prior cholecystectomy.  IMPRESSION: No  active cardiopulmonary disease.   Electronically Signed   By: Malachy MoanHeath  McCullough M.D.   On: 07/20/2013 14:04     MDM   1. Cough   2. Bronchospasm   3. Allergic rhinitis     Modest improvement with Duoneb. Breathing better, though. Continues to cough.  No wheezing, lungs clear.  Most likely cough due to bronchospasm and in combo with PND. Cont your meds including albuterol, we discussed technique for using it.  Start your tramadol for cough.  Try PPI in case there may be a reflux component.       Hayden Rasmussenavid Harlyn Rathmann, NP 07/20/13 1444

## 2013-12-13 ENCOUNTER — Other Ambulatory Visit: Payer: Self-pay

## 2014-10-02 ENCOUNTER — Encounter: Payer: Self-pay | Admitting: *Deleted

## 2014-11-18 ENCOUNTER — Encounter: Payer: Self-pay | Admitting: Cardiovascular Disease

## 2015-03-27 DIAGNOSIS — H6123 Impacted cerumen, bilateral: Secondary | ICD-10-CM | POA: Diagnosis not present

## 2015-03-27 DIAGNOSIS — H903 Sensorineural hearing loss, bilateral: Secondary | ICD-10-CM | POA: Diagnosis not present

## 2015-04-06 DIAGNOSIS — H903 Sensorineural hearing loss, bilateral: Secondary | ICD-10-CM | POA: Diagnosis not present

## 2015-09-10 DIAGNOSIS — F900 Attention-deficit hyperactivity disorder, predominantly inattentive type: Secondary | ICD-10-CM | POA: Diagnosis not present

## 2015-09-10 DIAGNOSIS — F329 Major depressive disorder, single episode, unspecified: Secondary | ICD-10-CM | POA: Diagnosis not present

## 2015-09-10 DIAGNOSIS — J309 Allergic rhinitis, unspecified: Secondary | ICD-10-CM | POA: Diagnosis not present

## 2015-09-10 DIAGNOSIS — K219 Gastro-esophageal reflux disease without esophagitis: Secondary | ICD-10-CM | POA: Diagnosis not present

## 2015-09-10 MED FILL — KETOROLAC 10 MG TABLET: 10 | 90 days supply | Qty: 180 | Fill #0

## 2015-09-10 MED FILL — LEVOTHYROXINE 50 MCG TABLET: 50 | 84 days supply | Qty: 90 | Fill #0

## 2015-09-10 MED FILL — LIOTHYRONINE SOD 5 MCG TAB: 5 | 90 days supply | Qty: 180 | Fill #0

## 2015-09-10 MED FILL — VENTOLIN HFA 90 MCG INHALER: 108 (90 BAS | 17 days supply | Qty: 18 | Fill #0

## 2015-09-10 MED FILL — FLUoxetine HCL 20 MG CAPS: 20 | 30 days supply | Qty: 30 | Fill #0

## 2015-09-10 MED FILL — LIDOCAINE 5% PATCH: 5 | 30 days supply | Qty: 30 | Fill #0

## 2015-09-10 MED FILL — PANTOPRAZOLE SOD DR 40 MG T: 40 | 90 days supply | Qty: 90 | Fill #0

## 2015-09-10 MED FILL — MONTELUKAST SOD 10 MG TAB: 10 | 90 days supply | Qty: 90 | Fill #0

## 2015-09-11 MED FILL — traMADol HCL 50 MG TABS: 50 | 15 days supply | Qty: 60 | Fill #0

## 2015-09-11 MED FILL — ALPRAZolam 0.5 MG TABS: 0.5 | 90 days supply | Qty: 90 | Fill #0

## 2015-09-11 MED FILL — DEXTROAMP-AMPHET ER 30 MG C: 30 | 30 days supply | Qty: 30 | Fill #0

## 2015-09-17 DIAGNOSIS — R5383 Other fatigue: Secondary | ICD-10-CM | POA: Diagnosis not present

## 2015-09-17 DIAGNOSIS — Z1321 Encounter for screening for nutritional disorder: Secondary | ICD-10-CM | POA: Diagnosis not present

## 2015-09-17 DIAGNOSIS — K219 Gastro-esophageal reflux disease without esophagitis: Secondary | ICD-10-CM | POA: Diagnosis not present

## 2015-09-17 DIAGNOSIS — Z13220 Encounter for screening for lipoid disorders: Secondary | ICD-10-CM | POA: Diagnosis not present

## 2015-09-17 DIAGNOSIS — F329 Major depressive disorder, single episode, unspecified: Secondary | ICD-10-CM | POA: Diagnosis not present

## 2015-09-17 DIAGNOSIS — E039 Hypothyroidism, unspecified: Secondary | ICD-10-CM | POA: Diagnosis not present

## 2015-09-23 DIAGNOSIS — E039 Hypothyroidism, unspecified: Secondary | ICD-10-CM | POA: Diagnosis not present

## 2015-09-23 DIAGNOSIS — J3089 Other allergic rhinitis: Secondary | ICD-10-CM | POA: Diagnosis not present

## 2015-09-23 DIAGNOSIS — K200 Eosinophilic esophagitis: Secondary | ICD-10-CM | POA: Diagnosis not present

## 2015-09-23 DIAGNOSIS — Z0000 Encounter for general adult medical examination without abnormal findings: Secondary | ICD-10-CM | POA: Diagnosis not present

## 2015-09-23 DIAGNOSIS — R5383 Other fatigue: Secondary | ICD-10-CM | POA: Diagnosis not present

## 2015-09-23 DIAGNOSIS — F900 Attention-deficit hyperactivity disorder, predominantly inattentive type: Secondary | ICD-10-CM | POA: Diagnosis not present

## 2015-09-23 DIAGNOSIS — J301 Allergic rhinitis due to pollen: Secondary | ICD-10-CM | POA: Diagnosis not present

## 2015-09-23 DIAGNOSIS — J4520 Mild intermittent asthma, uncomplicated: Secondary | ICD-10-CM | POA: Diagnosis not present

## 2015-10-02 MED FILL — EPINEPHRINE 0.3 MG AUTO-INJ: 0.3 | 30 days supply | Qty: 2 | Fill #0

## 2015-10-15 MED FILL — FLUoxetine HCL 20 MG CAPS: 20 | 30 days supply | Qty: 30 | Fill #0

## 2015-11-03 MED FILL — AMPHETAMINE SALTS 15 MG TAB: 15 | 30 days supply | Qty: 60 | Fill #0

## 2015-12-17 MED FILL — FLUoxetine HCL 20 MG CAPS: 20 | 30 days supply | Qty: 30 | Fill #1

## 2015-12-28 DIAGNOSIS — F329 Major depressive disorder, single episode, unspecified: Secondary | ICD-10-CM | POA: Diagnosis not present

## 2015-12-28 DIAGNOSIS — E039 Hypothyroidism, unspecified: Secondary | ICD-10-CM | POA: Diagnosis not present

## 2015-12-28 DIAGNOSIS — K219 Gastro-esophageal reflux disease without esophagitis: Secondary | ICD-10-CM | POA: Diagnosis not present

## 2015-12-28 DIAGNOSIS — F900 Attention-deficit hyperactivity disorder, predominantly inattentive type: Secondary | ICD-10-CM | POA: Diagnosis not present

## 2016-02-01 DIAGNOSIS — M797 Fibromyalgia: Secondary | ICD-10-CM | POA: Diagnosis not present

## 2016-02-01 DIAGNOSIS — F329 Major depressive disorder, single episode, unspecified: Secondary | ICD-10-CM | POA: Diagnosis not present

## 2016-02-01 MED FILL — FLUoxetine HCL 40 MG CAPS: 40 | 30 days supply | Qty: 30 | Fill #0

## 2016-03-01 DIAGNOSIS — E039 Hypothyroidism, unspecified: Secondary | ICD-10-CM | POA: Diagnosis not present

## 2016-03-01 DIAGNOSIS — M797 Fibromyalgia: Secondary | ICD-10-CM | POA: Diagnosis not present

## 2016-03-01 DIAGNOSIS — Z0000 Encounter for general adult medical examination without abnormal findings: Secondary | ICD-10-CM | POA: Diagnosis not present

## 2016-03-01 DIAGNOSIS — F329 Major depressive disorder, single episode, unspecified: Secondary | ICD-10-CM | POA: Diagnosis not present

## 2016-03-01 MED FILL — FLUoxetine HCL 40 MG CAPS: 40 | 90 days supply | Qty: 90 | Fill #0

## 2016-04-11 MED FILL — LIOTHYRONINE SOD 5 MCG TAB: 5 | 90 days supply | Qty: 180 | Fill #1

## 2016-04-11 MED FILL — PANTOPRAZOLE SOD DR 40 MG T: 40 | 90 days supply | Qty: 90 | Fill #1

## 2016-04-11 MED FILL — AMPHETAMINE SALTS 15 MG TAB: 15 | 30 days supply | Qty: 60 | Fill #0

## 2016-04-11 MED FILL — VENTOLIN HFA 90 MCG INHALER: 108 (90 BAS | 25 days supply | Qty: 18 | Fill #0

## 2016-04-11 MED FILL — LEVOTHYROXINE 50 MCG TABLET: 50 | 84 days supply | Qty: 90 | Fill #1

## 2016-04-11 MED FILL — KETOROLAC 10 MG TABLET: 10 | 90 days supply | Qty: 180 | Fill #1

## 2016-04-11 MED FILL — MONTELUKAST SOD 10 MG TAB: 10 | 90 days supply | Qty: 90 | Fill #1

## 2016-04-12 MED FILL — traMADol HCL 50 MG TABS: 50 | 30 days supply | Qty: 120 | Fill #0

## 2016-04-14 MED FILL — METHOCARBAMOL 500 MG TABLET: 500 | 30 days supply | Qty: 120 | Fill #0

## 2016-05-13 MED FILL — AMPHETAMINE SALTS 15 MG TAB: 15 | 30 days supply | Qty: 60 | Fill #0

## 2016-05-13 MED FILL — ALPRAZolam 0.5 MG TABS: 0.5 | 90 days supply | Qty: 90 | Fill #0

## 2016-06-08 MED FILL — FLUoxetine HCL 40 MG CAPS: 40 | 90 days supply | Qty: 90 | Fill #1

## 2016-08-29 DIAGNOSIS — Z79899 Other long term (current) drug therapy: Secondary | ICD-10-CM | POA: Diagnosis not present

## 2016-08-29 DIAGNOSIS — F419 Anxiety disorder, unspecified: Secondary | ICD-10-CM | POA: Diagnosis not present

## 2016-08-29 DIAGNOSIS — E039 Hypothyroidism, unspecified: Secondary | ICD-10-CM | POA: Diagnosis not present

## 2016-08-29 DIAGNOSIS — N390 Urinary tract infection, site not specified: Secondary | ICD-10-CM | POA: Diagnosis not present

## 2016-08-29 DIAGNOSIS — Z0000 Encounter for general adult medical examination without abnormal findings: Secondary | ICD-10-CM | POA: Diagnosis not present

## 2016-08-29 DIAGNOSIS — Z5181 Encounter for therapeutic drug level monitoring: Secondary | ICD-10-CM | POA: Diagnosis not present

## 2016-09-01 MED FILL — CIPROFLOXACIN HCL 250 MG TA: 250 | 7 days supply | Qty: 14 | Fill #0

## 2016-09-05 DIAGNOSIS — E039 Hypothyroidism, unspecified: Secondary | ICD-10-CM | POA: Diagnosis not present

## 2016-09-05 DIAGNOSIS — Z0000 Encounter for general adult medical examination without abnormal findings: Secondary | ICD-10-CM | POA: Diagnosis not present

## 2016-09-05 DIAGNOSIS — I351 Nonrheumatic aortic (valve) insufficiency: Secondary | ICD-10-CM | POA: Diagnosis not present

## 2016-09-14 ENCOUNTER — Telehealth: Payer: Self-pay | Admitting: Cardiovascular Disease

## 2016-09-14 NOTE — Telephone Encounter (Signed)
[facility] Dr Tresa EndoKelly.  Records put with Dr Landry DykeKelly's schedule for 10/07/16. lp

## 2016-10-07 ENCOUNTER — Encounter: Payer: Self-pay | Admitting: Cardiovascular Disease

## 2016-10-07 ENCOUNTER — Ambulatory Visit (INDEPENDENT_AMBULATORY_CARE_PROVIDER_SITE_OTHER): Payer: 59 | Admitting: Cardiovascular Disease

## 2016-10-07 VITALS — BP 132/92 | HR 85 | Ht 64.5 in | Wt 168.8 lb

## 2016-10-07 DIAGNOSIS — R0989 Other specified symptoms and signs involving the circulatory and respiratory systems: Secondary | ICD-10-CM | POA: Diagnosis not present

## 2016-10-07 DIAGNOSIS — I341 Nonrheumatic mitral (valve) prolapse: Secondary | ICD-10-CM | POA: Diagnosis not present

## 2016-10-07 DIAGNOSIS — I351 Nonrheumatic aortic (valve) insufficiency: Secondary | ICD-10-CM | POA: Diagnosis not present

## 2016-10-07 DIAGNOSIS — R030 Elevated blood-pressure reading, without diagnosis of hypertension: Secondary | ICD-10-CM

## 2016-10-07 DIAGNOSIS — G4733 Obstructive sleep apnea (adult) (pediatric): Secondary | ICD-10-CM | POA: Diagnosis not present

## 2016-10-07 NOTE — Patient Instructions (Signed)
Medication Instructions:  Your physician recommends that you continue on your current medications as directed. Please refer to the Current Medication list given to you today.  Labwork: NONE  Testing/Procedures: Your physician has requested that you have an echocardiogram. Echocardiography is a painless test that uses sound waves to create images of your heart. It provides your doctor with information about the size and shape of your heart and how well your heart's chambers and valves are working. This procedure takes approximately one hour. There are no restrictions for this procedure.  Your physician has requested that you have a carotid duplex. This test is an ultrasound of the carotid arteries in your neck. It looks at blood flow through these arteries that supply the brain with blood. Allow one hour for this exam. There are no restrictions or special instructions.  Your physician has recommended that you have a sleep study. This test records several body functions during sleep, including: brain activity, eye movement, oxygen and carbon dioxide blood levels, heart rate and rhythm, breathing rate and rhythm, the flow of air through your mouth and nose, snoring, body muscle movements, and chest and belly movement.  Follow-Up: Your physician recommends that you schedule a follow-up appointment in: 6-8 weeks (after testing) with Dr. Tresa EndoKelly.   Any Other Special Instructions Will Be Listed Below (If Applicable).     If you need a refill on your cardiac medications before your next appointment, please call your pharmacy.

## 2016-10-07 NOTE — Progress Notes (Signed)
Cardiology Office Note    Date:  10/09/2016   ID:  DENIS KOPPEL, DOB Feb 04, 1966, MRN [id number]  PCP:  Jani Gravel, MD  Cardiologist:  Shelva Majestic, MD   New cardiology evaluation  History of Present Illness:  Bethany Baker is a 51 y.o. female who is referred through the courtesy of Dr. Jani Gravel for evaluation of aortic insufficiency.  Bethany Baker reportedly has a history of mitral valve prolapse and aortic regurgitation, and in the past was felt to possibly have a bicuspid aortic valve.  In January 2001, she underwent a nuclear perfusion study after she developed complaints of chest pain radiating to her left arm with mild shortness of breath.  This revealed normal perfusion without scar or ischemia.  An ejection fraction of 64%.  She had remotely seen Dr. Tami Ribas.  She underwent a carotid duplex study in March 2005 which was mildly abnormal.  At the origin of the left carotid and left subclavian.  The aorta measured 4 cm.  There was no significant diameter reduction in the carotid arteries.  She had undergone an echo Doppler study in June 2013 which showed an EF of 60-65% with grade 1 diastolic dysfunction.  There was moderate central aortic insufficiency with a posteriorly directed jet that impaired excursion of the anterior mitral valve leaflet.  Her mitral valve had a parachute appearance with late systolic prolapse in the anterior mitral leaflet reduced leaflet excursion.  The peak gradient was 10 mm.  She has not had recent follow-up of her valvular pathology.  She had recently seen Dr. Jani Gravel.  He did appreciated a progressive aortic insufficiency murmur.  She has noticed some shortness of breath with activity and denies palpitations.  She is now referred for cardiology consultation.  Of note, the patient states she was originally diagnosed of as having obstructive sleep apnea and had undergone an initial sleep study in 2005 which showed an AHI of 11 per hour.  She apparently  underwent a CPAP titration in 2011 and was titrated up to 16 cm water pressure.  She states she has not used CPAP in over 5 years.  She currently admits to nonrestorative sleep, fatigue, and wakes up at least 2-3 times per night with nocturia.   She is unaware of any nocturnal palpitations.  She presents for evaluation.  Past Medical History:  Diagnosis Date  . Aortic insufficiency   . Depression   . Fibromyalgia   . MR (mitral regurgitation)   . MVP (mitral valve prolapse)   . Systolic murmur   . Thyroid disease    HYPOTHYROIDISM    Past Surgical History:  Procedure Laterality Date  . TRANSESOPHAGEAL ECHOCARDIOGRAM      Current Medications: Outpatient Medications Prior to Visit  Medication Sig Dispense Refill  . montelukast (SINGULAIR) 10 MG tablet Take 10 mg by mouth at bedtime.    Marland Kitchen albuterol (PROVENTIL HFA;VENTOLIN HFA) 108 (90 BASE) MCG/ACT inhaler Inhale into the lungs every 6 (six) hours as needed for wheezing or shortness of breath.    . dextromethorphan (DELSYM) 30 MG/5ML liquid Take by mouth as needed for cough.    . doxycycline (DORYX) 100 MG EC tablet Take 100 mg by mouth 2 (two) times daily.    . predniSONE (DELTASONE) 10 MG tablet Take 10 mg by mouth daily with breakfast.     No facility-administered medications prior to visit.      Allergies:   Acetaminophen; Aspirin; and Ibuprofen   Social History  Social History  . Marital status: Single    Spouse name: N/A  . Number of children: N/A  . Years of education: N/A   Social History Main Topics  . Smoking status: Never Smoker  . Smokeless tobacco: Never Used  . Alcohol use No  . Drug use: No  . Sexual activity: Not Asked   Other Topics Concern  . None   Social History Narrative  . None     Family History:  The patient's family history includes Cancer in her mother; Fibromyalgia in her mother; Heart failure in her father; Hypertension in her brother and father; Mitral valve prolapse in her mother.    ROS General: Negative; No fevers, chills, or night sweats;  HEENT: Negative; No changes in vision or hearing, sinus congestion, difficulty swallowing Pulmonary: Negative; No cough, wheezing, shortness of breath, hemoptysis Cardiovascular: see HPI GI: Negative; No nausea, vomiting, diarrhea, or abdominal pain GU: Negative; No dysuria, hematuria, or difficulty voiding Musculoskeletal: Occasional leg cramps c Hematologic/Oncology: Negative; no easy bruising, bleeding Endocrine: Negative; no heat/cold intolerance; no diabetes Neuro: Negative; no changes in balance, headaches Skin: Negative; No rashes or skin lesions Psychiatric: Negative; No behavioral problems, depression Sleep: Positive for untreated sleep apnea, fatigue, nonrestorative sleep, nocturia; she is unaware of snoring no bruxism, restless legs, hypnogognic hallucinations, no cataplexy Other comprehensive 14 point system review is negative.   Epworth Sleepiness Scale score calculated in the office today endorsed at 6, arguing against excessive daytime sleepiness   PHYSICAL EXAM:   VS:  BP (!) 132/92   Pulse 85   Ht 5' 4.5" (1.638 m)   Wt 168 lb 12.8 oz (76.6 kg)   BMI 28.53 kg/m      Wt Readings from Last 3 Encounters:  10/07/16 168 lb 12.8 oz (76.6 kg)  10/20/08 155 lb 4 oz (70.4 kg)  06/19/08 150 lb (68 kg)    General: Alert, oriented, no distress.  Skin: normal turgor, no rashes, warm and dry HEENT: Normocephalic, atraumatic. Pupils equal round and reactive to light; sclera anicteric; extraocular muscles intact; Fundi Normal; bilateral hearing aids Nose without nasal septal hypertrophy Mouth/Parynx benign; Mallinpatti scale 3 Neck: No JVD .  There is a question of carotid bruit versus transmitted murmur;  normal carotid upstroke Lungs: clear to ausculatation and percussion; no wheezing or rales Chest wall: without tenderness to palpitation Heart: PMI not displaced, RRR, s1 s2 normal, 6-9/6 systolic murmur, a  2/6 diastolic murmur.   no rubs, gallops, thrills, or heaves Abdomen: soft, nontender; no hepatosplenomehaly, BS+; abdominal aorta nontender and not dilated by palpation. Back: no CVA tenderness Pulses 2+ Musculoskeletal: full range of motion, normal strength, no joint deformities Extremities: no clubbing cyanosis or edema, Homan's sign negative  Neurologic: grossly nonfocal; Cranial nerves grossly wnl Psychologic: Normal mood and affect   Studies/Labs Reviewed:   EKG:  EKG is ordered today.  ECG (independently read by me): Normal sinus rhythm at 85 bpm.  Borderline voltage for LVH.  T-wave abnormality in lead 3 and aVF.  Recent Labs: BMP Latest Ref Rng & Units 10/20/2008 09/07/2007 03/24/2007  Glucose 70 - 99 mg/dL 86 81 125(H)  BUN 6 - 23 mg/dL '9 10 15  ' Creatinine 0.4 - 1.2 mg/dL 0.70 0.84 0.9  Sodium 135 - 145 mEq/L 138 139 140  Potassium 3.5 - 5.1 mEq/L 3.5 3.7 3.7  Chloride 96 - 112 mEq/L 103 103 106  CO2 19 - 32 mEq/L 29 23 -  Calcium 8.4 - 10.5 mg/dL 8.7  8.9 -     Hepatic Function Latest Ref Rng & Units 10/20/2008 09/07/2007  Total Protein 6.0 - 8.3 g/dL 6.8 6.9  Albumin 3.5 - 5.2 g/dL 3.9 4.5  AST 0 - 37 U/L 31 17  ALT 0 - 35 U/L 25 12  Alk Phosphatase 39 - 117 U/L 74 61  Total Bilirubin 0.3 - 1.2 mg/dL 0.6 0.5  Bilirubin, Direct 0.0 - 0.3 mg/dL - 0.1    CBC Latest Ref Rng & Units 10/20/2008 12/17/2007 03/24/2007  WBC 4.0 - 10.5 K/uL 5.7 5.3 -  Hemoglobin 12.0 - 15.0 g/dL 12.0 12.4 14.3  Hematocrit 36.0 - 46.0 % 36.1 35.9(L) 42.0  Platelets 150 - 400 K/uL 227 220 -   Lab Results  Component Value Date   MCV 89.8 10/20/2008   MCV 99.6 12/17/2007   Lab Results  Component Value Date   TSH 2.956 09/07/2007   No results found for: HGBA1C   BNP No results found for: BNP  ProBNP No results found for: PROBNP   Lipid Panel     Component Value Date/Time   CHOL 186 09/07/2007 2130   TRIG 80 09/07/2007 2130   HDL 72 09/07/2007 2130   CHOLHDL 2.6 Ratio  09/07/2007 2130   VLDL 16 09/07/2007 2130   LDLCALC 98 09/07/2007 2130     RADIOLOGY: No results found.   Additional studies/ records that were reviewed today include:  I reviewed the  Remote nuclear perfusion study, carotid duplex study, and the patient's last 2-D echo Doppler study from 2013.  I also reviewed her prior sleep evaluations and last CPAP titration trial of 2011.    ASSESSMENT:    1. Aortic valve insufficiency, etiology of cardiac valve disease unspecified   2. MVP (mitral valve prolapse)   3. Carotid bruit, unspecified laterality   4. OSA (obstructive sleep apnea)   5. Blood pressure elevated without history of HTN      PLAN:  My impression is that Bethany Baker is a very pleasant 8 -year-old female who has a history of mitral valve prolapse and previously noted at least moderate aortic insufficiency.  She also has a history of sleep apnea and had been on CPAP therapy, but has not used this for over 5 years.  On a prior carotid duplex study of 13 years ago, she was noted to have some borderline aortic dilation. At present, she is not on any medical therapy, but there was evidence for mild diastolic hypertension on her blood pressure.  When she was recently seen by Dr. Maudie Mercury.  Her blood pressure was 145/80, which is elevated per recent guidelines.  She may ultimately require initiation of antihypertensive medication.  On exam, she has a definite murmur of at least moderate aortic insufficiency as well as MR from mitral valve prolapse.  I'm scheduling her for 5 year follow-up echo Doppler study for reassessment.  I will also schedule her for a 13 year follow-up carotid duplex study.  She has untreated sleep apnea, although does not have daytime sleepiness.  I will schedule her for a follow-up sleep study for reassessment.    Medication Adjustments/Labs and Tests Ordered: Current medicines are reviewed at length with the patient today.  Concerns regarding medicines are outlined  above.  Medication changes, Labs and Tests ordered today are listed in the Patient Instructions below. Patient Instructions  Medication Instructions:  Your physician recommends that you continue on your current medications as directed. Please refer to the Current Medication list given to  you today.  Labwork: NONE  Testing/Procedures: Your physician has requested that you have an echocardiogram. Echocardiography is a painless test that uses sound waves to create images of your heart. It provides your doctor with information about the size and shape of your heart and how well your heart's chambers and valves are working. This procedure takes approximately one hour. There are no restrictions for this procedure.  Your physician has requested that you have a carotid duplex. This test is an ultrasound of the carotid arteries in your neck. It looks at blood flow through these arteries that supply the brain with blood. Allow one hour for this exam. There are no restrictions or special instructions.  Your physician has recommended that you have a sleep study. This test records several body functions during sleep, including: brain activity, eye movement, oxygen and carbon dioxide blood levels, heart rate and rhythm, breathing rate and rhythm, the flow of air through your mouth and nose, snoring, body muscle movements, and chest and belly movement.  Follow-Up: Your physician recommends that you schedule a follow-up appointment in: 6-8 weeks (after testing) with Dr. Claiborne Billings.   Any Other Special Instructions Will Be Listed Below (If Applicable).     If you need a refill on your cardiac medications before your next appointment, please call your pharmacy.      Signed, Shelva Majestic, MD  10/09/2016 3:28 PM    [facility] [address], [place], [place], [place]  [address] Phone: [phone]

## 2016-10-14 ENCOUNTER — Other Ambulatory Visit: Payer: Self-pay

## 2016-10-14 ENCOUNTER — Ambulatory Visit (HOSPITAL_COMMUNITY): Payer: 59 | Attending: Cardiology

## 2016-10-14 DIAGNOSIS — I080 Rheumatic disorders of both mitral and aortic valves: Secondary | ICD-10-CM | POA: Diagnosis not present

## 2016-10-14 DIAGNOSIS — I351 Nonrheumatic aortic (valve) insufficiency: Secondary | ICD-10-CM

## 2016-10-14 DIAGNOSIS — I420 Dilated cardiomyopathy: Secondary | ICD-10-CM | POA: Insufficient documentation

## 2016-10-14 DIAGNOSIS — I341 Nonrheumatic mitral (valve) prolapse: Secondary | ICD-10-CM

## 2016-10-14 DIAGNOSIS — I5030 Unspecified diastolic (congestive) heart failure: Secondary | ICD-10-CM | POA: Diagnosis not present

## 2016-10-18 ENCOUNTER — Ambulatory Visit (HOSPITAL_COMMUNITY)
Admission: RE | Admit: 2016-10-18 | Discharge: 2016-10-18 | Disposition: A | Discharge status: Home / Self Care | Payer: 59 | Source: Ambulatory Visit | Attending: Cardiology | Admitting: Cardiology

## 2016-10-18 ENCOUNTER — Other Ambulatory Visit: Payer: Self-pay | Admitting: *Deleted

## 2016-10-18 DIAGNOSIS — I351 Nonrheumatic aortic (valve) insufficiency: Secondary | ICD-10-CM

## 2016-10-18 DIAGNOSIS — I341 Nonrheumatic mitral (valve) prolapse: Secondary | ICD-10-CM

## 2016-10-18 DIAGNOSIS — I6523 Occlusion and stenosis of bilateral carotid arteries: Secondary | ICD-10-CM | POA: Diagnosis not present

## 2016-10-18 DIAGNOSIS — I6521 Occlusion and stenosis of right carotid artery: Secondary | ICD-10-CM | POA: Diagnosis not present

## 2016-10-18 DIAGNOSIS — R0989 Other specified symptoms and signs involving the circulatory and respiratory systems: Secondary | ICD-10-CM | POA: Diagnosis not present

## 2016-10-18 DIAGNOSIS — I77810 Thoracic aortic ectasia: Secondary | ICD-10-CM

## 2016-11-11 ENCOUNTER — Ambulatory Visit (INDEPENDENT_AMBULATORY_CARE_PROVIDER_SITE_OTHER)
Admission: RE | Admit: 2016-11-11 | Discharge: 2016-11-11 | Disposition: A | Discharge status: Home / Self Care | Payer: 59 | Source: Ambulatory Visit | Attending: Cardiovascular Disease | Admitting: Cardiovascular Disease

## 2016-11-11 DIAGNOSIS — I351 Nonrheumatic aortic (valve) insufficiency: Secondary | ICD-10-CM

## 2016-11-11 DIAGNOSIS — I77810 Thoracic aortic ectasia: Secondary | ICD-10-CM | POA: Diagnosis not present

## 2016-11-11 DIAGNOSIS — I712 Thoracic aortic aneurysm, without rupture: Secondary | ICD-10-CM | POA: Diagnosis not present

## 2016-11-11 DIAGNOSIS — I341 Nonrheumatic mitral (valve) prolapse: Secondary | ICD-10-CM

## 2016-11-11 MED ORDER — IOPAMIDOL (ISOVUE-370) INJECTION 76%
100.0000 mL | Freq: Once | INTRAVENOUS | Status: AC | PRN
  Administered 2016-11-11: 100 mL via INTRAVENOUS

## 2016-11-29 ENCOUNTER — Ambulatory Visit (HOSPITAL_BASED_OUTPATIENT_CLINIC_OR_DEPARTMENT_OTHER): Payer: 59 | Attending: Cardiovascular Disease | Admitting: Cardiovascular Disease

## 2016-11-29 VITALS — Ht 64.5 in | Wt 160.0 lb

## 2016-11-29 DIAGNOSIS — G4730 Sleep apnea, unspecified: Secondary | ICD-10-CM | POA: Diagnosis not present

## 2016-11-29 DIAGNOSIS — G4733 Obstructive sleep apnea (adult) (pediatric): Secondary | ICD-10-CM | POA: Diagnosis present

## 2016-12-15 ENCOUNTER — Encounter: Payer: Self-pay | Admitting: Cardiovascular Disease

## 2016-12-15 ENCOUNTER — Ambulatory Visit (INDEPENDENT_AMBULATORY_CARE_PROVIDER_SITE_OTHER): Payer: 59 | Admitting: Cardiovascular Disease

## 2016-12-15 VITALS — BP 152/100 | HR 85 | Ht 64.5 in | Wt 170.8 lb

## 2016-12-15 DIAGNOSIS — Z79899 Other long term (current) drug therapy: Secondary | ICD-10-CM

## 2016-12-15 DIAGNOSIS — G4730 Sleep apnea, unspecified: Secondary | ICD-10-CM | POA: Diagnosis not present

## 2016-12-15 DIAGNOSIS — I712 Thoracic aortic aneurysm, without rupture, unspecified: Secondary | ICD-10-CM

## 2016-12-15 DIAGNOSIS — I6521 Occlusion and stenosis of right carotid artery: Secondary | ICD-10-CM

## 2016-12-15 DIAGNOSIS — I351 Nonrheumatic aortic (valve) insufficiency: Secondary | ICD-10-CM

## 2016-12-15 DIAGNOSIS — I341 Nonrheumatic mitral (valve) prolapse: Secondary | ICD-10-CM

## 2016-12-15 DIAGNOSIS — I10 Essential (primary) hypertension: Secondary | ICD-10-CM

## 2016-12-15 MED ORDER — IRBESARTAN 150 MG PO TABS: 150.0000 mg | ORAL_TABLET | Freq: Every day | ORAL | 3 refills | Status: DC

## 2016-12-15 NOTE — Patient Instructions (Signed)
Medication Instructions:  START irbesartan 150 mg (1 tablet) daily  Labwork: Please return for labs in 2-3 weeks (BMET)  Our in office lab hours are Monday-Friday 8:00-4:30, closed for lunch 1-2 pm.  No appointment needed.  Follow-Up:  Your physician recommends that you schedule a follow-up appointment in: 3 months with Dr. Tresa EndoKelly.    Any Other Special Instructions Will Be Listed Below (If Applicable).  You have been referred to Dr.Bartle-Cardiothoracic Surgeon in [place]   If you need a refill on your cardiac medications before your next appointment, please call your pharmacy.

## 2016-12-15 NOTE — Progress Notes (Signed)
Cardiology Office Note    Date:  12/17/2016   ID:  [name], DOB 11/10/1965, MRN [id number]  PCP:  Jani Gravel, MD  Cardiologist:  Shelva Majestic, MD   New cardiology evaluation  History of Present Illness:  Bethany Baker is a 51 y.o. female who was referred through the courtesy of Dr. Jani Gravel for evaluation of aortic insufficiency. She presents for two-month follow-up cardiology evaluation.  Bethany Baker reportedly has a history of mitral valve prolapse and aortic regurgitation, and in the past was felt to possibly have a bicuspid aortic valve.  In January 2001, she underwent a nuclear perfusion study after she developed complaints of chest pain radiating to her left arm with mild shortness of breath.  This revealed normal perfusion without scar or ischemia.  An ejection fraction of 64%.  She had remotely seen Dr. Tami Ribas.  She underwent a carotid duplex study in March 2005 which was mildly abnormal.  At the origin of the left carotid and left subclavian.  The aorta measured 4 cm.  There was no significant diameter reduction in the carotid arteries.  She had undergone an echo Doppler study in June 2013 which showed an EF of 60-65% with grade 1 diastolic dysfunction.  There was moderate central aortic insufficiency with a posteriorly directed jet that impaired excursion of the anterior mitral valve leaflet.  Her mitral valve had a parachute appearance with late systolic prolapse in the anterior mitral leaflet reduced leaflet excursion.  The peak gradient was 10 mm.  She has not had recent follow-up of her valvular pathology.  She had recently seen Dr. Jani Gravel.  He did appreciated a progressive aortic insufficiency murmur.  She had noticed some shortness of breath with activity and denies palpitations.  .  When I initially saw her, she told me that she was originally diagnosed of as having obstructive sleep apnea and had undergone an initial sleep study in 2005 which showed an AHI of 11  per hour.  She apparently underwent a CPAP titration in 2011 and was titrated up to 16 cm water pressure.  She had not used CPAP in over 5 years.  She currently admits to nonrestorative sleep, fatigue, and was waking upt 2-3 times per night with nocturia.   She was unaware of any nocturnal palpitations.    She underwent an echo Doppler study in 10/14/2016 which showed normal systolic function with an EF of 55-60%.  There was grade 1 diastolic dysfunction.  There was a thickened aortic valve with mild-to-moderate aortic insufficiency, mild prolapse of anterior mitral valve leaflet with trace MR, and her ascending aorta was found to be dilated at 4.6 cm.  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  Carotid duplex imaging showed soft plaque in the right bifurcation with 1-39% stenosis.  There was no dilation at the origin of the left carotid artery and subclavian artery measuring 3.0 x 3.0 cm.  She had normal subclavian arteries bilaterally, and patent vertebral arteries with antegrade flow.  She also underwent a sleep study which was done on 11/29/2016.  Today, her overall AHI was 2.9, but her RDI was 11.5, which was similar to her remote AHI of years ago.  During REM sleep her AHI was 12.6/h.  Although she felt that she did not sleep well that night, she had 82.7% sleep efficiency.  Wake after sleep onset was only 47.1 minutes.  REM latency was  75 minutes.  She presents for evaluation  Past Medical History:  Diagnosis Date  . Aortic insufficiency   . Depression   . Fibromyalgia   . MR (mitral regurgitation)   . MVP (mitral valve prolapse)   . Systolic murmur   . Thyroid disease    HYPOTHYROIDISM    Past Surgical History:  Procedure Laterality Date  . TRANSESOPHAGEAL ECHOCARDIOGRAM      Current Medications: Outpatient Medications Prior to Visit  Medication Sig Dispense Refill  . albuterol (PROVENTIL HFA;VENTOLIN HFA) 108  (90 Base) MCG/ACT inhaler Inhale 2 puffs into the lungs every 6 (six) hours as needed for wheezing or shortness of breath.    . ALPRAZolam (XANAX) 0.5 MG tablet Take 0.5 mg by mouth at bedtime as needed for anxiety.    . lidocaine (LIDODERM) 5 % Place 1 patch onto the skin daily. Remove & Discard patch within 12 hours or as directed by MD    . loratadine (CLARITIN) 10 MG tablet Take 10 mg by mouth daily.    . methocarbamol (ROBAXIN) 500 MG tablet Take 500 mg by mouth 4 (four) times daily.    . montelukast (SINGULAIR) 10 MG tablet Take 10 mg by mouth at bedtime.    . Multiple Vitamin (MULTIVITAMIN) tablet Take 1 tablet by mouth daily.    . traMADol (ULTRAM) 50 MG tablet Take 50 mg by mouth every 6 (six) hours as needed.     No facility-administered medications prior to visit.      Allergies:   Acetaminophen; Aspirin; Eggs or egg-derived products; Ibuprofen; and Mercury   Social History   Social History  . Marital status: Single    Spouse name: N/A  . Number of children: N/A  . Years of education: N/A   Social History Main Topics  . Smoking status: Never Smoker  . Smokeless tobacco: Never Used  . Alcohol use No  . Drug use: No  . Sexual activity: Not Asked   Other Topics Concern  . None   Social History Narrative  . None     Family History:  The patient's family history includes Cancer in her mother; Fibromyalgia in her mother; Heart failure in her father; Hypertension in her brother and father; Mitral valve prolapse in her mother.   ROS General: Negative; No fevers, chills, or night sweats;  HEENT: Negative; No changes in vision or hearing, sinus congestion, difficulty swallowing Pulmonary: Negative; No cough, wheezing, shortness of breath, hemoptysis Cardiovascular: see HPI GI: Negative; No nausea, vomiting, diarrhea, or abdominal pain GU: Negative; No dysuria, hematuria, or difficulty voiding Musculoskeletal: Occasional leg cramps c Hematologic/Oncology: Negative; no  easy bruising, bleeding Endocrine: Negative; no heat/cold intolerance; no diabetes Neuro: Negative; no changes in balance, headaches Skin: Negative; No rashes or skin lesions Psychiatric: Negative; No behavioral problems, depression Sleep: Positive for  sleep apnea, fatigue, nonrestorative sleep, nocturia; she is unaware of snoring no bruxism, restless legs, hypnogognic hallucinations, no cataplexy Other comprehensive 14 point system review is negative.   Epworth Sleepiness Scale score calculated in the office today endorsed at 6, arguing against excessive daytime sleepiness   PHYSICAL EXAM:   VS:  BP (!) 152/100   Pulse 85   Ht 5' 4.5" (1.638 m)   Wt 170 lb 12.8 oz (77.5 kg)   BMI 28.86 kg/m     Repeat blood pressure by me was 154/90  Wt Readings from Last 3 Encounters:  12/15/16 170 lb 12.8 oz (77.5 kg)  11/29/16 160 lb (72.6 kg)  10/07/16 168 lb 12.8 oz (76.6 kg)    General: Alert, oriented, no distress.  Skin: normal turgor, no rashes, warm and dry HEENT: Normocephalic, atraumatic. Pupils equal round and reactive to light; sclera anicteric; extraocular muscles intact;  Nose without nasal septal hypertrophy Mouth/Parynx benign; Mallinpatti scale 3 Neck: No JVD, transmitted murmur versus carotid bruit; normal carotid upstroke Lungs: clear to ausculatation and percussion; no wheezing or rales Chest wall: without tenderness to palpitation Heart: PMI not displaced, RRR, s1 s2 normal, 5-3/6 systolic murmur and 2/6  diastolic murmur in the aortic area, no rubs, gallops, thrills, or heaves Abdomen: soft, nontender; no hepatosplenomehaly, BS+; abdominal aorta nontender and not dilated by palpation. Back: no CVA tenderness Pulses 2+ Musculoskeletal: full range of motion, normal strength, no joint deformities Extremities: no clubbing cyanosis or edema, Homan's sign negative  Neurologic: grossly nonfocal; Cranial nerves grossly wnl Psychologic: Normal mood and  affect   Studies/Labs Reviewed:   EKG:  EKG is ordered today. ECG (independently read by me): Normal sinus rhythm at 85 bpm.  Left axis deviation.  LVH by voltage criteria.  August 2018 ECG (independently read by me): Normal sinus rhythm at 85 bpm.  Borderline voltage for LVH.  T-wave abnormality in lead 3 and aVF.  Recent Labs: BMP Latest Ref Rng & Units 10/20/2008 09/07/2007 03/24/2007  Glucose 70 - 99 mg/dL 86 81 125(H)  BUN 6 - 23 mg/dL '9 10 15  ' Creatinine 0.4 - 1.2 mg/dL 0.70 0.84 0.9  Sodium 135 - 145 mEq/L 138 139 140  Potassium 3.5 - 5.1 mEq/L 3.5 3.7 3.7  Chloride 96 - 112 mEq/L 103 103 106  CO2 19 - 32 mEq/L 29 23 -  Calcium 8.4 - 10.5 mg/dL 8.7 8.9 -     Hepatic Function Latest Ref Rng & Units 10/20/2008 09/07/2007  Total Protein 6.0 - 8.3 g/dL 6.8 6.9  Albumin 3.5 - 5.2 g/dL 3.9 4.5  AST 0 - 37 U/L 31 17  ALT 0 - 35 U/L 25 12  Alk Phosphatase 39 - 117 U/L 74 61  Total Bilirubin 0.3 - 1.2 mg/dL 0.6 0.5  Bilirubin, Direct 0.0 - 0.3 mg/dL - 0.1    CBC Latest Ref Rng & Units 10/20/2008 12/17/2007 03/24/2007  WBC 4.0 - 10.5 K/uL 5.7 5.3 -  Hemoglobin 12.0 - 15.0 g/dL 12.0 12.4 14.3  Hematocrit 36.0 - 46.0 % 36.1 35.9(L) 42.0  Platelets 150 - 400 K/uL 227 220 -   Lab Results  Component Value Date   MCV 89.8 10/20/2008   MCV 99.6 12/17/2007   Lab Results  Component Value Date   TSH 2.956 09/07/2007   No results found for: HGBA1C   BNP No results found for: BNP  ProBNP No results found for: PROBNP   Lipid Panel     Component Value Date/Time   CHOL 186 09/07/2007 2130   TRIG 80 09/07/2007 2130   HDL 72 09/07/2007 2130   CHOLHDL 2.6 Ratio 09/07/2007 2130   VLDL 16 09/07/2007 2130   LDLCALC 98 09/07/2007 2130     RADIOLOGY: No results found.   Additional studies/ records that were reviewed today include:  I reviewed the  Remote nuclear perfusion study, carotid duplex study, and the patient's last 2-D echo Doppler study from 2013.  I also reviewed  her prior sleep evaluations and last CPAP titration trial of 2011.    ASSESSMENT:    1. Thoracic aortic aneurysm without rupture ([place])   2. Aortic valve insufficiency, etiology of cardiac  valve disease unspecified   3. MVP (mitral valve prolapse)   4. Essential hypertension   5. Medication management   6. Sleep apnea, unspecified type   7. Atherosclerosis of right carotid artery      PLAN:   Bethany Baker is a very pleasant 60 -year-old female who has a history of mitral valve prolapse and previously noted at least moderate aortic insufficiency.  She also has a history of sleep apnea and had been on CPAP therapy, but has not used this for over 5 years.  On a prior carotid duplex study of 13 years ago, she was noted to have some borderline aortic dilation.  I initially saw her, she had a murmur of at least moderate aortic insufficiency as well as MR from mitral valve prolapse.  I reviewed her 5 year follow-up echo Doppler study with her in detail.  This confirmed aortic valve thickening with a mean gradient of 8, peak gradient of 14, and at least mild-to-moderate aortic insufficiency.  There was mild prolapse of anterior mitral valve leaflet with no stenosis and only trivial MR.  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  Subsequent CT angiography suggested this to be 4.7 cm involving the ascending thoracic aorta.  I have recommended a referral to Dr. Gilford Raid for follow-up and timing of future evaluations.  Her blood pressure today is elevated.  I discussed the importance of more optimal blood pressure control.  I am starting her on irbesartan 150 mg daily, which should also provide some improvement in diastolic function.  She will have a follow-up BMET in 2 weeks to make certain potassium and renal function are stable.  I thoroughly reviewed her sleep study.  She has upper airway resistance syndrome, sleep apnea unspecified type and by overall AHI does not meet criteria for CPAP therapy.   However, she did have arousals leading to a respiratory disturbance index elevated at 11.5 per hour and she had definite mild sleep apnea with REM sleep with any AHI of 12.6 per hour.  We discussed efforts to optimize nasal and oral pharyngeal patency.  There was mild to moderate snoring.  We will need to follow this and I also discussed potential customized oral appliance.  I will see her in 3 months for cardiology reevaluation.  Medication Adjustments/Labs and Tests Ordered: Current medicines are reviewed at length with the patient today.  Concerns regarding medicines are outlined above.  Medication changes, Labs and Tests ordered today are listed in the Patient Instructions below. Patient Instructions  Medication Instructions:  START irbesartan 150 mg (1 tablet) daily  Labwork: Please return for labs in 2-3 weeks (BMET)  Our in office lab hours are Monday-Friday 8:00-4:30, closed for lunch 1-2 pm.  No appointment needed.  Follow-Up:  Your physician recommends that you schedule a follow-up appointment in: 3 months with Dr. Claiborne Billings.    Any Other Special Instructions Will Be Listed Below (If Applicable).  You have been referred to Dr.Bartle-Cardiothoracic Surgeon in [place]   If you need a refill on your cardiac medications before your next appointment, please call your pharmacy.      Signed, Shelva Majestic, MD  12/17/2016 6:50 PM    [facility] [address], [place], [place], [place]  [address] Phone: [phone]

## 2016-12-18 NOTE — Procedures (Signed)
Patient Name: Bethany Baker, Sharde Study Date: 11/29/2016 Gender: Female D.O.B: [birth year] Age (years): 51 Referring Provider: Nicki Guadalajarahomas Kelly MD, ABSM Height (inches): 65 Interpreting Physician: Nicki Guadalajarahomas Kelly MD, ABSM Weight (lbs): 160 RPSGT: Ulyess MortSpruill, Vicki BMI: 27 MRN: [id number] Neck Size: 13.50  CLINICAL INFORMATION Sleep Study Type: NPSG  Indication for sleep study: Fatigue, OSA, Snoring  Epworth Sleepiness Score: 7  Most recent polysomnogram dated 07/31/2003 revealed an AHI of 11/h. Most recent titration study dated 03/31/2009 was optimal at 10cm H2O with an AHI of 0.0/h.  SLEEP STUDY TECHNIQUE As per the AASM Manual for the Scoring of Sleep and Associated Events v2.3 (April 2016) with a hypopnea requiring 4% desaturations.  The channels recorded and monitored were frontal, central and occipital EEG, electrooculogram (EOG), submentalis EMG (chin), nasal and oral airflow, thoracic and abdominal wall motion, anterior tibialis EMG, snore microphone, electrocardiogram, and pulse oximetry.  MEDICATIONS *    albuterol (PROVENTIL HFA;VENTOLIN HFA) 108 (90 Base) MCG/ACT inhaler    Inhale 2 puffs into the lungs every 6 (six) hours as needed for wheezing or shortness of breath.      *    ALPRAZolam (XANAX) 0.5 MG tablet    Take 0.5 mg by mouth at bedtime as needed for anxiety.   *    lidocaine (LIDODERM) 5 %    Place 1 patch onto the skin daily. Remove & Discard patch within 12 hours or as directed by MD           *    loratadine (CLARITIN) 10 MG tablet    Take 10 mg by mouth daily.           *    methocarbamol (ROBAXIN) 500 MG tablet    Take 500 mg by mouth 4 (four) times daily.           *    montelukast (SINGULAIR) 10 MG tablet    Take 10 mg by mouth at bedtime.           *    Multiple Vitamin (MULTIVITAMIN) tablet    Take 1 tablet by mouth daily.           *    traMADol (ULTRAM) 50 MG tablet    Take 50 mg by mouth every 6 (six) hours as needed.             Medications self-administered  by patient taken the night of the study : N/A  SLEEP ARCHITECTURE The study was initiated at 10:12:38 PM and ended at 4:31:40 AM.  Sleep onset time was 18.4 minutes and the sleep efficiency was 82.7%. The total sleep time was 313.5 minutes.  Stage REM latency was 75.0 minutes.  The patient spent 22.49% of the night in stage N1 sleep, 58.85% in stage N2 sleep, 1.91% in stage N3 and 16.75% in REM.  Alpha intrusion was absent.  Supine sleep was 18.94%.  RESPIRATORY PARAMETERS The overall apnea/hypopnea index (AHI) was 2.9 per hour. The respiratory disturbance index (RDI) was 11.5 per hour.  There were 2 total apneas, including 1 obstructive, 0 central and 1 mixed apneas. There were 13 hypopneas and 45 RERAs.  The AHI during Stage REM sleep was 12.6 per hour.  AHI while supine was 3.0 per hour.  The mean oxygen saturation was 93.94%. The minimum SpO2 during sleep was 89.00%.  moderate snoring was noted during this study.  CARDIAC DATA The 2 lead EKG demonstrated sinus rhythm. The mean heart rate was 75.05 beats per  minute. Other EKG findings include: None.  LEG MOVEMENT DATA The total PLMS were 52 with a resulting PLMS index of 9.95. Associated arousal with leg movement index was 0.4 .  IMPRESSIONS - Increased upper airway resistance syndrome (UARS) with an overall AHI of 2.9/h; however, RDI was 11.5 per hour.  There was mild sleep apnea, doing REM sleep with an AHI of 12.6 per hour - No significant central sleep apnea occurred during this study (CAI = 0.0/h). - Minimal oxygen desaturation to a nadir of 89%. - The patient snored with moderate snoring volume. - No cardiac abnormalities were noted during this study. - Mild periodic limb movements of sleep occurred during the study. No significant associated arousals. - test 2 of template DIAGNOSIS - Sleep apnea, unspecified type G47.30  RECO MMENDATIONS - At present, patient does not meet criteria for CPAP therapy. There is mild  sleep apnea, doing REMsleep - Efforts should be made to optimize nasal and oral pharyngeal patency. - Consider alternatives for the treatment of snoring - Avoid alcohol, sedatives and other CNS depressants that may worsen sleep apnea and disrupt normal sleep architecture. - Sleep hygiene should be reviewed to assess factors that may improve sleep quality. - Weight management and regular exercise should be initiated or continued if appropriate.  [Electronically signed] 12/18/2016 01:18 PM  Nicki Guadalajara MD, [facility], ABSM Diplomate, American Board of Sleep Medicine   NPI: [id number]   [facility] PH: [phone]   FX: [phone] ACCREDITED BY THE AMERICAN ACADEMY OF SLEEP MEDICINE

## 2016-12-29 MED FILL — FLUoxetine HCL 40 MG CAPS: 40 | 90 days supply | Qty: 90 | Fill #2

## 2016-12-30 MED FILL — KETOROLAC 10 MG TABLET: 10 | 90 days supply | Qty: 180 | Fill #0

## 2016-12-30 MED FILL — traMADol HCL 50 MG TABS: 50 | 30 days supply | Qty: 120 | Fill #0

## 2017-01-02 MED FILL — IRBESARTAN 150 MG TAB: 150 | 90 days supply | Qty: 90 | Fill #0

## 2017-01-11 ENCOUNTER — Encounter: Payer: 59 | Admitting: Surgery

## 2017-01-25 ENCOUNTER — Institutional Professional Consult (permissible substitution) (INDEPENDENT_AMBULATORY_CARE_PROVIDER_SITE_OTHER): Payer: 59 | Admitting: Surgery

## 2017-01-25 ENCOUNTER — Other Ambulatory Visit: Payer: Self-pay

## 2017-01-25 ENCOUNTER — Encounter: Payer: Self-pay | Admitting: Surgery

## 2017-01-25 VITALS — BP 148/94 | HR 75 | Ht 64.5 in | Wt 165.0 lb

## 2017-01-25 DIAGNOSIS — I712 Thoracic aortic aneurysm, without rupture, unspecified: Secondary | ICD-10-CM

## 2017-01-29 ENCOUNTER — Encounter: Payer: Self-pay | Admitting: Surgery

## 2017-01-29 NOTE — Progress Notes (Signed)
Cardiothoracic Surgery Consultation   PCP is Pearson Grippe, MD Referring Provider is Lennette Bihari, MD  Chief Complaint  Patient presents with  . Thoracic Aortic Aneurysm    CTA chest 10/27/2016    HPI:  The patient is a 51 year old woman with a history of mitral valve prolapse and aortic insufficiency that was felt to be due to a bicuspid aortic valve. She had been seen in the past by Dr. Jenne Campus and had moderate central AI and some anterior mitral leaflet prolapse but had not been followed for her valve disease for many years. She recently saw Dr. Selena Batten and was felt to have a more prominent murmur and reported some shortness of breath. An echo on 10/14/2016 showed a trileaflet aortic valve with mildly thickened leaflets and mild to moderate AI with an AI pressure half time of 526 ms. The ascending aortic diameter was measured at 46 mm. There was trivial MR with mild anterior leaflet prolapse. She had a CTA of the chest on 11/11/2016 showing a 4.7 cm fusiform ascending aortic aneurysm. The transverse arch was 3.0 cm.  Past Medical History:  Diagnosis Date  . Aortic insufficiency   . Depression   . Fibromyalgia   . MR (mitral regurgitation)   . MVP (mitral valve prolapse)   . Systolic murmur   . Thyroid disease    HYPOTHYROIDISM    Past Surgical History:  Procedure Laterality Date  . TRANSESOPHAGEAL ECHOCARDIOGRAM      Family History  Problem Relation Age of Onset  . Mitral valve prolapse Mother   . Cancer Mother   . Fibromyalgia Mother   . Hypertension Father   . Heart failure Father   . Hypertension Brother     Social History Social History   Tobacco Use  . Smoking status: Never Smoker  . Smokeless tobacco: Never Used  Substance Use Topics  . Alcohol use: No  . Drug use: No    Current Outpatient Medications  Medication Sig Dispense Refill  . amphetamine-dextroamphetamine (ADDERALL) 15 MG tablet Take by mouth daily. Pt takes 0.5 tablet    . esomeprazole  (NEXIUM) 20 MG capsule Take 20 mg by mouth daily at 12 noon.    . irbesartan (AVAPRO) 150 MG tablet Take 1 tablet (150 mg total) by mouth daily. 90 tablet 3  . levothyroxine (SYNTHROID, LEVOTHROID) 50 MCG tablet Take 50 mcg by mouth daily before breakfast. Takes additional 1.5 on sundays    . liothyronine (CYTOMEL) 5 MCG tablet Take 5 mcg by mouth daily.    . Multiple Vitamin (MULTIVITAMIN) tablet Take 1 tablet by mouth daily.    . traMADol (ULTRAM) 50 MG tablet Take 50 mg by mouth every 6 (six) hours as needed.    Marland Kitchen albuterol (PROVENTIL HFA;VENTOLIN HFA) 108 (90 Base) MCG/ACT inhaler Inhale 2 puffs into the lungs every 6 (six) hours as needed for wheezing or shortness of breath.    . ALPRAZolam (XANAX) 0.5 MG tablet Take 0.5 mg by mouth at bedtime as needed for anxiety.    Marland Kitchen FLUoxetine (PROZAC) 40 MG capsule   4  . ketorolac (TORADOL) 10 MG tablet as needed.  0  . lidocaine (LIDODERM) 5 % Place 1 patch onto the skin daily. Remove & Discard patch within 12 hours or as directed by MD    . loratadine (CLARITIN) 10 MG tablet Take 10 mg by mouth daily.    . methocarbamol (ROBAXIN) 500 MG tablet Take 500 mg by mouth 4 (  four) times daily.    . montelukast (SINGULAIR) 10 MG tablet Take 10 mg by mouth at bedtime.     No current facility-administered medications for this visit.     Allergies  Allergen Reactions  . Acetaminophen   . Adhesive [Tape]     irritation  . Aspirin   . Effexor [Venlafaxine]     Paranoid when driving  . Eggs Or Egg-Derived Products Other (See Comments)  . Ibuprofen   . Mercury Swelling  . Wellbutrin [Bupropion]     Paranoid while driving  . Atenolol     Memory loss    Review of Systems  Constitutional: Positive for fatigue. Negative for activity change.       Weight gain  HENT: Positive for hearing loss.   Eyes: Negative.   Respiratory: Positive for apnea, cough and shortness of breath.        Asthma   Cardiovascular: Negative for chest pain, palpitations and  leg swelling.  Gastrointestinal: Positive for diarrhea.       Reflux Hiatal hernia Some difficulty swallowing  Endocrine: Negative.   Genitourinary: Negative.   Musculoskeletal: Positive for arthralgias and myalgias.  Skin: Negative.   Allergic/Immunologic: Negative.   Neurological: Positive for headaches.       Chronic pain  Hematological: Bruises/bleeds easily.  Psychiatric/Behavioral: The patient is nervous/anxious.        Depression    BP (!) 148/94 (BP Location: Left Arm, Patient Position: Sitting, Cuff Size: Normal)   Pulse 75   Ht 5' 4.5" (1.638 m)   Wt 165 lb (74.8 kg)   SpO2 98%   BMI 27.88 kg/m  Physical Exam  Constitutional: She is oriented to person, place, and time. She appears well-developed and well-nourished. No distress.  HENT:  Head: Normocephalic and atraumatic.  Mouth/Throat: Oropharynx is clear and moist.  Eyes: Conjunctivae and EOM are normal. Pupils are equal, round, and reactive to light.  Neck: Normal range of motion. Neck supple. No JVD present. No thyromegaly present.  Cardiovascular: Normal rate and regular rhythm.  Murmur heard. 2/6 systolic murmur RSB 2/6 diastolic murmur LLSB  Pulmonary/Chest: Effort normal and breath sounds normal. No respiratory distress. She has no wheezes. She has no rales.  Abdominal: Soft. Bowel sounds are normal. She exhibits no distension and no mass. There is no tenderness.  Musculoskeletal: Normal range of motion. She exhibits no edema.  Lymphadenopathy:    She has no cervical adenopathy.  Neurological: She is alert and oriented to person, place, and time.  Skin: Skin is warm and dry.  Psychiatric: She has a normal mood and affect.     Diagnostic Tests:   [facility]                        [address]                        [place], [place] [address]                            [phone]  ------------------------------------------------------------------- Transthoracic  Echocardiography  Patient:    Lorayne, Getchell MR #:       [id number] Study Date: 10/14/2016 Gender:     F Age:        51 Height:     163.8 cm Weight:     76.6 kg BSA:  1.89 m^2 Pt. Status: Room:   ORDERING     Nicki Guadalajarahomas Kelly, M.D.  REFERRING    Nicki Guadalajarahomas Kelly, M.D.  ATTENDING    Olga MillersBrian Crenshaw  SONOGRAPHER  Aida RaiderNaTashia Rodgers, RDCS  PERFORMING   [facility], Outpatient  cc:  ------------------------------------------------------------------- LV EF: 55% -   60%  ------------------------------------------------------------------- Indications:      I35.1 Aortic Valve Insufficiency.  I34.1 Mitral Valve Prolapse.  ------------------------------------------------------------------- History:   PMH:  Acquired from the patient and from the patient&'s chart.  PMH:  Aortic Insufficiency. Mitral Valve Prolapse. Fibromyalgia. Murmur. Thyroid Disease.  ------------------------------------------------------------------- Study Conclusions  - Left ventricle: The cavity size was normal. Wall thickness was   normal. Systolic function was normal. The estimated ejection   fraction was in the range of 55% to 60%. Wall motion was normal;   there were no regional wall motion abnormalities. Doppler   parameters are consistent with abnormal left ventricular   relaxation (grade 1 diastolic dysfunction). - Aortic valve: There was mild to moderate regurgitation. - Ascending aorta: The ascending aorta was moderately dilated. - Mitral valve: Mild prolapse, involving the anterior leaflet.  Impressions:  - Normal LV systolic function; mild diastolic dysfunction;   thickened aortic valve with mild to moderate AI; mild prolapse of   anterior MV leaflet with trace MR; ascending aorta dilated (4.6   cm); suggest CTA or MRA to further assess.  ------------------------------------------------------------------- Study data:   Study status:  Routine.  Procedure:  The patient reported no pain pre or  post test. Transthoracic echocardiography for left ventricular function evaluation, for right ventricular function evaluation, and for assessment of valvular function. Image quality was adequate.  Study completion:  There were no complications.          Transthoracic echocardiography.  M-mode, complete 2D, spectral Doppler, and color Doppler.  Birthdate: Patient birthdate: 1965-04-07.  Age:  Patient is 51 yr old.  Sex: Gender: female.    BMI: 28.5 kg/m^2.  Blood pressure:     132/92 Patient status:  Outpatient.  Study date:  Study date: 10/14/2016. Study time: 10:17 AM.  Location:  [facility]  -------------------------------------------------------------------  ------------------------------------------------------------------- Left ventricle:  The cavity size was normal. Wall thickness was normal. Systolic function was normal. The estimated ejection fraction was in the range of 55% to 60%. Wall motion was normal; there were no regional wall motion abnormalities. Doppler parameters are consistent with abnormal left ventricular relaxation (grade 1 diastolic dysfunction).  ------------------------------------------------------------------- Aortic valve:   Trileaflet; mildly thickened leaflets. Mobility was not restricted.  Doppler:  Transvalvular velocity was within the normal range. There was no stenosis. There was mild to moderate regurgitation.    VTI ratio of LVOT to aortic valve: 0.64. Valve area (VTI): 2.43 cm^2. Indexed valve area (VTI): 1.29 cm^2/m^2. Peak velocity ratio of LVOT to aortic valve: 0.64. Valve area (Vmax): 2.42 cm^2. Indexed valve area (Vmax): 1.28 cm^2/m^2. Mean velocity ratio of LVOT to aortic valve: 0.63. Valve area (Vmean): 2.4 cm^2. Indexed valve area (Vmean): 1.27 cm^2/m^2.    Mean gradient (S): 8 mm Hg. Peak gradient (S): 14 mm Hg.  ------------------------------------------------------------------- Aorta:  Aortic root: The aortic root was  normal in size. Ascending aorta: The ascending aorta was moderately dilated.  ------------------------------------------------------------------- Mitral valve:  Mobility was not restricted.  Mild prolapse, involving the anterior leaflet.  Doppler:  Transvalvular velocity was within the normal range. There was no evidence for stenosis. There was trivial regurgitation.  ------------------------------------------------------------------- Left atrium:  The atrium was normal in size.  -------------------------------------------------------------------  Right ventricle:  The cavity size was normal. Systolic function was normal.  ------------------------------------------------------------------- Pulmonic valve:    Doppler:  Transvalvular velocity was within the normal range. There was no evidence for stenosis. There was trivial regurgitation.  ------------------------------------------------------------------- Tricuspid valve:   Structurally normal valve.    Doppler: Transvalvular velocity was within the normal range. There was trivial regurgitation.  ------------------------------------------------------------------- Right atrium:  The atrium was normal in size.  ------------------------------------------------------------------- Pericardium:  There was no pericardial effusion.  ------------------------------------------------------------------- Systemic veins: Inferior vena cava: The vessel was normal in size.  ------------------------------------------------------------------- Measurements   Left ventricle                            Value          Reference  LV ID, ED, PLAX chordal                   50.7  mm       43 - 52  LV ID, ES, PLAX chordal                   27.3  mm       23 - 38  LV fx shortening, PLAX chordal            46    %        >=29  LV PW thickness, ED                       11.4  mm       ---------  IVS/LV PW ratio, ED                       0.89            <=1.3  Stroke volume, 2D                         88    ml       ---------  Stroke volume/bsa, 2D                     47    ml/m^2   ---------  LV e&', lateral                            6.69  cm/s     ---------  LV E/e&', lateral                          9.49           ---------  LV e&', medial                             5.15  cm/s     ---------  LV E/e&', medial                           12.33          ---------  LV e&', average                            5.92  cm/s     ---------  LV E/e&', average  10.73          ---------    Ventricular septum                        Value          Reference  IVS thickness, ED                         10.2  mm       ---------    LVOT                                      Value          Reference  LVOT ID, S                                22    mm       ---------  LVOT area                                 3.8   cm^2     ---------  LVOT ID                                   22    mm       ---------  LVOT peak velocity, S                     117   cm/s     ---------  LVOT mean velocity, S                     85.4  cm/s     ---------  LVOT VTI, S                               23.1  cm       ---------  LVOT peak gradient, S                     5     mm Hg    ---------  Stroke volume (SV), LVOT DP               87.8  ml       ---------  Stroke index (SV/bsa), LVOT DP            46.5  ml/m^2   ---------    Aortic valve                              Value          Reference  Aortic valve peak velocity, S             184   cm/s     ---------  Aortic valve mean velocity, S             135   cm/s     ---------  Aortic valve VTI, S  36.1  cm       ---------  Aortic mean gradient, S                   8     mm Hg    ---------  Aortic peak gradient, S                   14    mm Hg    ---------  VTI ratio, LVOT/AV                        0.64           ---------  Aortic valve area, VTI                    2.43  cm^2      ---------  Aortic valve area/bsa, VTI                1.29  cm^2/m^2 ---------  Velocity ratio, peak, LVOT/AV             0.64           ---------  Aortic valve area, peak velocity          2.42  cm^2     ---------  Aortic valve area/bsa, peak               1.28  cm^2/m^2 ---------  velocity  Velocity ratio, mean, LVOT/AV             0.63           ---------  Aortic valve area, mean velocity          2.4   cm^2     ---------  Aortic valve area/bsa, mean               1.27  cm^2/m^2 ---------  velocity  Aortic regurg pressure half-time          526   ms       ---------    Aorta                                     Value          Reference  Aortic root ID, ED                        35    mm       ---------  Ascending aorta ID, A-P, S                46    mm       ---------    Left atrium                               Value          Reference  LA ID, A-P, ES                            48    mm       ---------  LA ID/bsa, A-P                    (H)     2.54  cm/m^2   <=  2.2  LA volume, S                              51    ml       ---------  LA volume/bsa, S                          27    ml/m^2   ---------  LA volume, ES, 1-p A4C                    40    ml       ---------  LA volume/bsa, ES, 1-p A4C                21.2  ml/m^2   ---------  LA volume, ES, 1-p A2C                    59    ml       ---------  LA volume/bsa, ES, 1-p A2C                31.3  ml/m^2   ---------    Mitral valve                              Value          Reference  Mitral E-wave peak velocity               63.5  cm/s     ---------  Mitral A-wave peak velocity               101   cm/s     ---------  Mitral deceleration time                  211   ms       150 - 230  Mitral E/A ratio, peak                    0.6            ---------    Tricuspid valve                           Value          Reference  Tricuspid regurg peak velocity            194   cm/s     ---------  Tricuspid peak RV-RA gradient             15     mm Hg    ---------    Right ventricle                           Value          Reference  RV s&', lateral, S                         14.1  cm/s     ---------  Legend: (L)  and  (H)  mark values outside specified reference range.  ------------------------------------------------------------------- Prepared and Electronically Authenticated by  Olga Millers 2018-08-17T11:45:29   CLINICAL DATA:  Ascending aortic dilatation.  EXAM: CT ANGIOGRAPHY CHEST WITH CONTRAST  TECHNIQUE: Multidetector CT  imaging of the chest was performed using the standard protocol during bolus administration of intravenous contrast. Multiplanar CT image reconstructions and MIPs were obtained to evaluate the vascular anatomy.  CONTRAST:  100 mL of Isovue 370 intravenously.  COMPARISON:  Radiographs of Jul 20, 2013.  FINDINGS: Cardiovascular: 4.7 cm ascending thoracic aortic aneurysm is noted. No dissection is noted. Transverse aortic arch measures 3.0 cm. Proximal descending thoracic aorta measures 2.9 cm. Great vessels are widely patent, with common origin of right innominate and left common carotid arteries. No pericardial effusion.  Mediastinum/Nodes: No enlarged mediastinal, hilar, or axillary lymph nodes. Thyroid gland, trachea, and esophagus demonstrate no significant findings.  Lungs/Pleura: Lungs are clear. No pleural effusion or pneumothorax.  Upper Abdomen: No acute abnormality.  Musculoskeletal: No chest wall abnormality. No acute or significant osseous findings.  Review of the MIP images confirms the above findings.  IMPRESSION: 4.7 cm ascending thoracic aortic aneurysm. Recommend semi-annual imaging followup by CTA or MRA and referral to cardiothoracic surgery if not already obtained. This recommendation follows 2010 ACCF/AHA/AATS/ACR/ASA/SCA/SCAI/SIR/STS/SVM Guidelines for the Diagnosis and Management of Patients With Thoracic Aortic Disease. Circulation.  2010; 121: Z610-R604.   Electronically Signed   By: Lupita Raider, M.D.   On: 11/11/2016 08:36   Impression:  This 51 year old woman has a 4.7 cm fusiform ascending aortic aneurysm and mild to moderate AI on echo. Her LV function is normal and her LV internal dimensions are normal. I don't think there is any indication for surgery at this time but she will require close followup. I reviewed the CT and echo images with her and answered her questions. I will repeat her CTA chest in 6 months to establish stability and then decide on longer term surveillance after that. With a trileaflet aortic valve I would generally not recommend surgery until the aneurysm was 5.5 cm or if there was a period of rapid enlargement which is 5 mm over a one year period. Her AI and LV function/dimensions will have to be followed at least yearly. She does report some exertional shortness of breath but it is hard to tell if that is related to her AI or her other issues. This will need to be followed closely.   Plan:  I will see her back in 6 months with a CTA of the chest. She will continue to followup with Dr. Tresa Endo for her aortic valve disease.   I spent 60 minutes performing this consultation and > 50% of this time was spent face to face counseling and coordinating the care of this patient's aortic aneurysm.   Alleen Borne, MD [facility] [phone]

## 2017-02-27 DIAGNOSIS — R011 Cardiac murmur, unspecified: Secondary | ICD-10-CM | POA: Insufficient documentation

## 2017-02-27 DIAGNOSIS — I340 Nonrheumatic mitral (valve) insufficiency: Secondary | ICD-10-CM | POA: Insufficient documentation

## 2017-02-27 DIAGNOSIS — F32A Depression, unspecified: Secondary | ICD-10-CM | POA: Insufficient documentation

## 2017-02-27 DIAGNOSIS — I341 Nonrheumatic mitral (valve) prolapse: Secondary | ICD-10-CM | POA: Insufficient documentation

## 2017-02-27 DIAGNOSIS — F329 Major depressive disorder, single episode, unspecified: Secondary | ICD-10-CM | POA: Insufficient documentation

## 2017-02-27 DIAGNOSIS — I351 Nonrheumatic aortic (valve) insufficiency: Secondary | ICD-10-CM | POA: Insufficient documentation

## 2017-02-27 DIAGNOSIS — M797 Fibromyalgia: Secondary | ICD-10-CM | POA: Insufficient documentation

## 2017-02-27 DIAGNOSIS — E079 Disorder of thyroid, unspecified: Secondary | ICD-10-CM | POA: Insufficient documentation

## 2017-03-10 ENCOUNTER — Encounter: Payer: Self-pay | Admitting: Cardiovascular Disease

## 2017-03-10 ENCOUNTER — Ambulatory Visit: Payer: 59 | Admitting: Cardiovascular Disease

## 2017-03-10 VITALS — BP 142/90 | HR 84 | Ht 64.5 in | Wt 171.0 lb

## 2017-03-10 DIAGNOSIS — I341 Nonrheumatic mitral (valve) prolapse: Secondary | ICD-10-CM | POA: Diagnosis not present

## 2017-03-10 DIAGNOSIS — I712 Thoracic aortic aneurysm, without rupture, unspecified: Secondary | ICD-10-CM

## 2017-03-10 DIAGNOSIS — I10 Essential (primary) hypertension: Secondary | ICD-10-CM

## 2017-03-10 DIAGNOSIS — I351 Nonrheumatic aortic (valve) insufficiency: Secondary | ICD-10-CM | POA: Diagnosis not present

## 2017-03-10 DIAGNOSIS — R0989 Other specified symptoms and signs involving the circulatory and respiratory systems: Secondary | ICD-10-CM | POA: Diagnosis not present

## 2017-03-10 DIAGNOSIS — G4730 Sleep apnea, unspecified: Secondary | ICD-10-CM

## 2017-03-10 MED ORDER — IRBESARTAN 300 MG PO TABS: 300.0000 mg | ORAL_TABLET | Freq: Every day | ORAL | 3 refills | Status: DC

## 2017-03-10 NOTE — Patient Instructions (Addendum)
Medication Instructions:  INCREASE irbesartan to 300 mg daily  Testing/Procedures: Your physician has requested that you have an echocardiogram in July/August. Echocardiography is a painless test that uses sound waves to create images of your heart. It provides your doctor with information about the size and shape of your heart and how well your heart's chambers and valves are working. This procedure takes approximately one hour. There are no restrictions for this procedure.  [facility]  [address]   Follow-Up: Your physician wants you to follow-up in: August with Dr. Tresa EndoKelly. You will receive a reminder letter in the mail two months in advance. If you don't receive a letter, please call our office to schedule the follow-up appointment.   Any Other Special Instructions Will Be Listed Below (If Applicable).  We will send referral to Dr. Halford DecampMark Katz-their office will contact you for an appointment.   If you need a refill on your cardiac medications before your next appointment, please call your pharmacy.

## 2017-03-10 NOTE — Progress Notes (Signed)
Cardiology Office Note    Date:  03/10/2017   ID:  Bethany Baker, DOB [birth year], MRN [id number]  PCP:  Jani Gravel, MD  Cardiologist:  Shelva Majestic, MD   New cardiology evaluation  History of Present Illness:  Bethany Baker is a 52 y.o. female who was referred through the courtesy of Dr. Jani Gravel for evaluation of aortic insufficiency. She presents for 3 month follow-up cardiology evaluation.  Ms. Bethany Baker reportedly has a history of mitral valve prolapse and aortic regurgitation, and in the past was felt to possibly have a bicuspid aortic valve.  In January 2001, she underwent a nuclear perfusion study after she developed complaints of chest pain radiating to her left arm with mild shortness of breath.  This revealed normal perfusion without scar or ischemia.  An ejection fraction of 64%.  She had remotely seen Dr. Tami Ribas.  She underwent a carotid duplex study in March 2005 which was mildly abnormal.  At the origin of the left carotid and left subclavian.  The aorta measured 4 cm.  There was no significant diameter reduction in the carotid arteries.  She had undergone an echo Doppler study in June 2013 which showed an EF of 60-65% with grade 1 diastolic dysfunction.  There was moderate central aortic insufficiency with a posteriorly directed jet that impaired excursion of the anterior mitral valve leaflet.  Her mitral valve had a parachute appearance with late systolic prolapse in the anterior mitral leaflet reduced leaflet excursion.  The peak gradient was 10 mm.  She has not had recent follow-up of her valvular pathology.  She had recently seen Dr. Jani Gravel.  He did appreciated a progressive aortic insufficiency murmur.  She had noticed some shortness of breath with activity and denies palpitations.  .  When I initially saw her, she told me that she was originally diagnosed of as having obstructive sleep apnea and had undergone an initial sleep study in 2005 which showed an AHI of 11 per  hour.  She apparently underwent a CPAP titration in 2011 and was titrated up to 16 cm water pressure.  She had not used CPAP in over 5 years.  She currently admits to nonrestorative sleep, fatigue, and was waking upt 2-3 times per night with nocturia.   She was unaware of any nocturnal palpitations.    She underwent an echo Doppler study in 10/14/2016 which showed normal systolic function with an EF of 55-60%.  There was grade 1 diastolic dysfunction.  There was a thickened aortic valve with mild-to-moderate aortic insufficiency, mild prolapse of anterior mitral valve leaflet with trace MR, and her ascending aorta was found to be dilated at 4.6 cm.  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  Carotid duplex imaging showed soft plaque in the right bifurcation with 1-39% stenosis.  There was no dilation at the origin of the left carotid artery and subclavian artery measuring 3.0 x 3.0 cm.  She had normal subclavian arteries bilaterally, and patent vertebral arteries with antegrade flow.  She  underwent a F/U sleep study on 11/29/2016.  Overall AHI was 2.9, but her RDI was 11.5, which was similar to her remote AHI of years ago.  During REM sleep her AHI was 12.6/h.  Although she felt that she did not sleep well that night, she had 82.7% sleep efficiency.  Wake after sleep onset was only 47.1 minutes.  REM latency was 75 minutes.  WhenI last saw her, in light of her ascending aneurysm.  I recommended institution of irbesartan 150 mg daily, which be hopeful both for blood pressure control as well as her diastolic dysfunction.  I referred her to Dr. 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  At present, there is no indication for surgery at this time, but she will require close follow-up.  Her CTA of her chest.  We repeated in 6 months to establish stability and then to decide  on longer-term surveillance.  Surgery would not be recommended until the aneurysm was 5.5 cm, or there was a period of rapid enlargement of 5 mm over a year.  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  Typically she works the night shift, usually from 8 PM to 8 AM but at times has to work.  Additional shifts commencing at 6 PM.  The days that she is not working.  She is switching her sleep cycle and as result, has not established a good circadian rhythm. .  She does admit to some fatigue.  She does admit to some difficulty with sleep. She presents for follow-up evaluation per  Past Medical History:  Diagnosis Date  . Aortic insufficiency   . Depression   . Fibromyalgia   . MR (mitral regurgitation)   . MVP (mitral valve prolapse)   . Systolic murmur   . Thyroid disease    HYPOTHYROIDISM    Past Surgical History:  Procedure Laterality Date  . TRANSESOPHAGEAL ECHOCARDIOGRAM      Current Medications: Outpatient Medications Prior to Visit  Medication Sig Dispense Refill  . albuterol (PROVENTIL HFA;VENTOLIN HFA) 108 (90 Base) MCG/ACT inhaler Inhale 2 puffs into the lungs every 6 (six) hours as needed for wheezing or shortness of breath.    . ALPRAZolam (XANAX) 0.5 MG tablet Take 0.5 mg by mouth at bedtime as needed for anxiety.    Marland Kitchen amphetamine-dextroamphetamine (ADDERALL) 15 MG tablet Take by mouth daily. Pt takes 0.5 tablet    . esomeprazole (NEXIUM) 20 MG capsule Take 20 mg by mouth daily at 12 noon.    Marland Kitchen FLUoxetine (PROZAC) 40 MG capsule   4  . ketorolac (TORADOL) 10 MG tablet as needed.  0  . levothyroxine (SYNTHROID, LEVOTHROID) 50 MCG tablet Take 50 mcg by mouth daily before breakfast. Takes additional 1.5 on sundays    . lidocaine (LIDODERM) 5 % Place 1 patch onto the skin daily. Remove & Discard patch within 12 hours or as directed by MD    . liothyronine (CYTOMEL) 5 MCG tablet Take 5 mcg by mouth daily.    Marland Kitchen loratadine (CLARITIN) 10 MG tablet Take 10 mg by  mouth daily.    . methocarbamol (ROBAXIN) 500 MG tablet Take 500 mg by mouth 4 (four) times daily.    . montelukast (SINGULAIR) 10 MG tablet Take 10 mg by mouth at bedtime.    . Multiple Vitamin (MULTIVITAMIN) tablet Take 1 tablet by mouth daily.    . traMADol (ULTRAM) 50 MG tablet Take 50 mg by mouth every 6 (six) hours as needed.    . irbesartan (AVAPRO) 150 MG tablet Take 1 tablet (150 mg total) by mouth daily. 90 tablet 3   No facility-administered medications prior to visit.      Allergies:   Acetaminophen; Adhesive [tape]; Aspirin; Effexor [venlafaxine]; Eggs or egg-derived products; Ibuprofen; Mercury; Wellbutrin [bupropion]; and Atenolol   Social History   Socioeconomic  History  . Marital status: Single    Spouse name: None  . Number of children: None  . Years of education: None  . Highest education level: None  Social Needs  . Financial resource strain: None  . Food insecurity - worry: None  . Food insecurity - inability: None  . Transportation needs - medical: None  . Transportation needs - non-medical: None  Occupational History  . None  Tobacco Use  . Smoking status: Never Smoker  . Smokeless tobacco: Never Used  Substance and Sexual Activity  . Alcohol use: No  . Drug use: No  . Sexual activity: None  Other Topics Concern  . None  Social History Narrative  . None     Family History:  The patient's family history includes Cancer in her mother; Fibromyalgia in her mother; Heart failure in her father; Hypertension in her brother and father; Mitral valve prolapse in her mother.   ROS General: Negative; No fevers, chills, or night sweats;  HEENT: Negative; No changes in vision or hearing, sinus congestion, difficulty swallowing Pulmonary: Negative; No cough, wheezing, shortness of breath, hemoptysis Cardiovascular: see HPI GI: Negative; No nausea, vomiting, diarrhea, or abdominal pain GU: Negative; No dysuria, hematuria, or difficulty voiding Musculoskeletal:  Occasional leg cramps c Hematologic/Oncology: Negative; no easy bruising, bleeding Endocrine: Negative; no heat/cold intolerance; no diabetes Neuro: Negative; no changes in balance, headaches Skin: Negative; No rashes or skin lesions Psychiatric: Negative; No behavioral problems, depression Sleep: Positive for  sleep apnea, fatigue, nonrestorative sleep, nocturia; she is unaware of snoring no bruxism, restless legs, hypnogognic hallucinations, no cataplexy Other comprehensive 14 point system review is negative.   Epworth Sleepiness Scale score calculated at her last office visit in October 2018 and endorsed at 6, arguing against excessive daytime sleepiness   PHYSICAL EXAM:   VS:  BP (!) 142/90   Pulse 84   Ht 5' 4.5" (1.638 m)   Wt 171 lb (77.6 kg)   BMI 28.90 kg/m     Repeat blood pressure by me was 138/86.  Wt Readings from Last 3 Encounters:  03/10/17 171 lb (77.6 kg)  01/25/17 165 lb (74.8 kg)  12/15/16 170 lb 12.8 oz (77.5 kg)   General: Alert, oriented, no distress.  Skin: normal turgor, no rashes, warm and dry HEENT: Normocephalic, atraumatic. Pupils equal round and reactive to light; sclera anicteric; extraocular muscles intact;  Nose without nasal septal hypertrophy Mouth/Parynx benign; Mallinpatti scale 3 Neck: No JVD, soft R carotid bruits; normal carotid upstroke Lungs: clear to ausculatation and percussion; no wheezing or rales Chest wall: without tenderness to palpitation Heart: PMI not displaced, RRR, s1 s2 normal, 9-1/6 systolic murmur, 2/6 diastolic murmur, no rubs, gallops, thrills, or heaves Abdomen: soft, nontender; no hepatosplenomehaly, BS+; abdominal aorta nontender and not dilated by palpation. Back: no CVA tenderness Pulses 2+ Musculoskeletal: full range of motion, normal strength, no joint deformities Extremities: no clubbing cyanosis or edema, Homan's sign negative  Neurologic: grossly nonfocal; Cranial nerves grossly wnl Psychologic: Normal mood  and affect  Studies/Labs Reviewed:   EKG:  EKG is ordered today. ECG (independently read by me): Normal sinus rhythm 84 bpm.  LVH by voltage criteria in aVL.  Nonspecific T changes.  October 2018 ECG (independently read by me): Normal sinus rhythm at 85 bpm.  Left axis deviation.  LVH by voltage criteria.  August 2018 ECG (independently read by me): Normal sinus rhythm at 85 bpm.  Borderline voltage for LVH.  T-wave abnormality in lead 3 and  aVF.  Recent Labs: BMP Latest Ref Rng & Units 10/20/2008 09/07/2007 03/24/2007  Glucose 70 - 99 mg/dL 86 81 125(H)  BUN 6 - 23 mg/dL _0 Creatinine 0.4 - 1.2 mg/dL 0.70 0.84 0.9  Sodium 135 - 145 mEq/L 138 139 140  Potassium 3.5 - 5.1 mEq/L 3.5 3.7 3.7  Chloride 96 - 112 mEq/L 103 103 106  CO2 19 - 32 mEq/L 29 23 -  Calcium 8.4 - 10.5 mg/dL 8.7 8.9 -     Hepatic Function Latest Ref Rng & Units 10/20/2008 09/07/2007  Total Protein 6.0 - 8.3 g/dL 6.8 6.9  Albumin 3.5 - 5.2 g/dL 3.9 4.5  AST 0 - 37 U/L 31 17  ALT 0 - 35 U/L 25 12  Alk Phosphatase 39 - 117 U/L 74 61  Total Bilirubin 0.3 - 1.2 mg/dL 0.6 0.5  Bilirubin, Direct 0.0 - 0.3 mg/dL - 0.1    CBC Latest Ref Rng & Units 10/20/2008 12/17/2007 03/24/2007  WBC 4.0 - 10.5 K/uL 5.7 5.3 -  Hemoglobin 12.0 - 15.0 g/dL 12.0 12.4 14.3  Hematocrit 36.0 - 46.0 % 36.1 35.9(L) 42.0  Platelets 150 - 400 K/uL 227 220 -   Lab Results  Component Value Date   MCV 89.8 10/20/2008   MCV 99.6 12/17/2007   Lab Results  Component Value Date   TSH 2.956 09/07/2007   No results found for: HGBA1C   BNP No results found for: BNP  ProBNP No results found for: PROBNP   Lipid Panel     Component Value Date/Time   CHOL 186 09/07/2007 2130   TRIG 80 09/07/2007 2130   HDL 72 09/07/2007 2130   CHOLHDL 2.6 Ratio 09/07/2007 2130   VLDL 16 09/07/2007 2130   LDLCALC 98 09/07/2007 2130     RADIOLOGY: No results found.   Additional studies/ records that were reviewed today include:  I reviewed  the  Remote nuclear perfusion study, carotid duplex study, and the patient's last 2-D echo Doppler study from 2013.  I also reviewed her prior sleep evaluations and last CPAP titration trial of 2011.  I reviewed the records from Dr. Cyndia Bent , as well as her CT angiogram.  An echo Doppler data.   ASSESSMENT:    1. Aortic valve insufficiency, etiology of cardiac valve disease unspecified   2. MVP (mitral valve prolapse)   3. Thoracic aortic aneurysm without rupture ([place])   4. Essential hypertension   5. Sleep apnea, unspecified type   6. Carotid bruit, unspecified laterality      PLAN:   Bethany Baker is a very pleasant 52 year-old female who has a history of mitral valve prolapse and previously noted at least moderate aortic insufficiency.  She also has a history of sleep apnea and had been on CPAP therapy remotely, but has not used this for over 5 years.  On a prior carotid duplex study of 14 years ago, she was noted to have some borderline aortic dilation.  I initially saw her, she had a murmur of at least moderate aortic insufficiency as well as MR from mitral valve prolapse.  I reviewed her 5 year follow-up echo Doppler study with her in detail.  This confirmed aortic valve thickening with a mean gradient of 8, peak gradient of 14, and at least mild-to-moderate aortic insufficiency.  There was mild prolapse of anterior mitral valve leaflet with no stenosis and only trivial MR.  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  Subsequent  CT angiography suggested this to be 4.7 cm involving the ascending thoracic aorta.  She underwent evaluation by Dr. Cyndia Bent.  At present, there is no surgical indication for repair and she will undergo a six-month follow-up CT angiography study to assess stability and to determine frequency of future evaluations.  When I last saw her, her blood pressure was elevated.  Her blood pressure is improved today on irbesartan 150 mg but is still not at goal.  I am recommending  further titration of irbesartan to 300 mg daily.  She will require follow-up of her left ventricular dimensions and in July/August 2019.  I am recommending a one-year follow-up echo Doppler assessment.  She will be undergoing CTA of her chest prior to that evaluation with follow-up with Dr. Cyndia Bent.  I again reviewed her sleep study.  On that study, she did have sleep apnea, unspecified type, with an RDI of 11.5 per hour and a rems sleep AHI of 12.6 per hour.  Her overall AHI, however, was low at 2.9.  She has continued to experience some sleep difficulty.  However, her sleep cycle shifts since.  Oftentimes she works the evening shift 3 days per week and may revert to a daytime schedule on nonworking days.  I have suggested an evaluation with Dr. Oneal Grout for consideration of a customized oral appliance, which will be beneficial in improving her mild sleep-disordered breathing.  I will see her in August for follow-up evaluation and additional recommendations were made at that time.   Medication Adjustments/Labs and Tests Ordered: Current medicines are reviewed at length with the patient today.  Concerns regarding medicines are outlined above.  Medication changes, Labs and Tests ordered today are listed in the Patient Instructions below. Patient Instructions  Medication Instructions:  INCREASE irbesartan to 300 mg daily  Testing/Procedures: Your physician has requested that you have an echocardiogram in July/August. Echocardiography is a painless test that uses sound waves to create images of your heart. It provides your doctor with information about the size and shape of your heart and how well your heart's chambers and valves are working. This procedure takes approximately one hour. There are no restrictions for this procedure.  This will be done at our [facility] location:  [place]: Your physician wants you to follow-up in: August with Dr. Claiborne Billings. You will  receive a reminder letter in the mail two months in advance. If you don't receive a letter, please call our office to schedule the follow-up appointment.   Any Other Special Instructions Will Be Listed Below (If Applicable).  We will send referral to Dr. Morrell Riddle office will contact you for an appointment.   If you need a refill on your cardiac medications before your next appointment, please call your pharmacy.      Signed, Shelva Majestic, MD  03/10/2017 10:33 AM    [facility] [address], [place], [place], [place]  [address] Phone: [phone]

## 2017-04-10 ENCOUNTER — Encounter: Payer: Self-pay | Admitting: *Deleted

## 2017-04-19 ENCOUNTER — Telehealth: Payer: Self-pay | Admitting: *Deleted

## 2017-04-20 NOTE — Telephone Encounter (Signed)
Returned a call to patient to notify [facility] that she does not meet the criteria for CPAP therapy. She will need to return to Dr Myrtis SerKatz for oral appliance fitting. Patient informs me that she did not have a good experience with Dr Myrtis SerKatz and will not use his services. I recommended to patient to find another dentist that will treat OSA and make oral appliances. I will send the referral to them for her. Patient states she will do some research and call me back with the information once she decides to get fitted.

## 2017-04-20 NOTE — Telephone Encounter (Signed)
New Message   Patient is returning call in reference to cpap. Please call to discuss..Marland Kitchen

## 2017-06-20 ENCOUNTER — Encounter: Payer: Self-pay | Admitting: Surgery

## 2017-06-21 ENCOUNTER — Other Ambulatory Visit: Payer: Self-pay | Admitting: Surgery

## 2017-06-21 DIAGNOSIS — I712 Thoracic aortic aneurysm, without rupture, unspecified: Secondary | ICD-10-CM

## 2017-07-26 ENCOUNTER — Ambulatory Visit
Admission: RE | Admit: 2017-07-26 | Discharge: 2017-07-26 | Disposition: A | Discharge status: Home / Self Care | Payer: 59 | Source: Ambulatory Visit | Attending: Surgery | Admitting: Surgery

## 2017-07-26 ENCOUNTER — Encounter: Payer: Self-pay | Admitting: Surgery

## 2017-07-26 ENCOUNTER — Ambulatory Visit: Payer: 59 | Admitting: Surgery

## 2017-07-26 ENCOUNTER — Other Ambulatory Visit: Payer: Self-pay

## 2017-07-26 VITALS — BP 168/95 | HR 82 | Resp 18 | Ht 64.5 in | Wt 174.4 lb

## 2017-07-26 DIAGNOSIS — I712 Thoracic aortic aneurysm, without rupture, unspecified: Secondary | ICD-10-CM

## 2017-07-26 MED ORDER — IOPAMIDOL (ISOVUE-370) INJECTION 76%
75.0000 mL | Freq: Once | INTRAVENOUS | Status: AC | PRN
  Administered 2017-07-26: 75 mL via INTRAVENOUS

## 2017-07-26 NOTE — Progress Notes (Signed)
HPI:  The patient returns today for follow-up of a 4.7 cm fusiform ascending aortic aneurysm with mild to moderate aortic insufficiency.  I initially saw her on 01/25/2017.  Her last echo on 10/14/2016 showed a trileaflet aortic valve with mildly thickened leaflets and mild to moderate aortic insufficiency with a AI pressure half-time of 526 ms.  The ascending aortic diameter was 46 mm by echo.  CT scan of the chest on 11/11/2016 showed a 4.7 cm fusiform ascending aortic aneurysm with bovine origin of the innominate and left common carotid artery from a large trunk.  The transverse arch was 3.0 cm.  She has been feeling well since I last saw her with no chest or back pain.  She denies any shortness of breath.  Current Outpatient Medications  Medication Sig Dispense Refill  . albuterol (PROVENTIL HFA;VENTOLIN HFA) 108 (90 Base) MCG/ACT inhaler Inhale 2 puffs into the lungs every 6 (six) hours as needed for wheezing or shortness of breath.    . ALPRAZolam (XANAX) 0.5 MG tablet Take 0.5 mg by mouth at bedtime as needed for anxiety.    Marland Kitchen amphetamine-dextroamphetamine (ADDERALL) 15 MG tablet Take by mouth daily. Pt takes 0.5 tablet    . esomeprazole (NEXIUM) 20 MG capsule Take 20 mg by mouth daily at 12 noon.    Marland Kitchen FLUoxetine (PROZAC) 40 MG capsule   4  . irbesartan (AVAPRO) 300 MG tablet Take 1 tablet (300 mg total) by mouth daily. 90 tablet 3  . ketorolac (TORADOL) 10 MG tablet as needed.  0  . levothyroxine (SYNTHROID, LEVOTHROID) 50 MCG tablet Take 50 mcg by mouth daily before breakfast. Takes additional 1.5 on sundays    . lidocaine (LIDODERM) 5 % Place 1 patch onto the skin daily. Remove & Discard patch within 12 hours or as directed by MD    . liothyronine (CYTOMEL) 5 MCG tablet Take 5 mcg by mouth daily.    Marland Kitchen loratadine (CLARITIN) 10 MG tablet Take 10 mg by mouth daily.    . methocarbamol (ROBAXIN) 500 MG tablet Take 500 mg by mouth 4 (four) times daily.    . montelukast (SINGULAIR) 10 MG  tablet Take 10 mg by mouth at bedtime.    . Multiple Vitamin (MULTIVITAMIN) tablet Take 1 tablet by mouth daily.    . traMADol (ULTRAM) 50 MG tablet Take 50 mg by mouth every 6 (six) hours as needed.     No current facility-administered medications for this visit.      Physical Exam: BP (!) 168/95 (BP Location: Right Arm, Patient Position: Sitting, Cuff Size: Normal)   Pulse 82   Resp 18   Ht 5' 4.5" (1.638 m)   Wt 174 lb 6.4 oz (79.1 kg)   SpO2 98% Comment: RA  BMI 29.47 kg/m  She looks well. Cardiac exam shows a regular rate and rhythm with a 2 out of 6 diastolic murmur along the left lower sternal border.  I do not hear a systolic murmur today. Lungs are clear. There is no peripheral edema.  Diagnostic Tests:  CLINICAL DATA:  Thoracic aortic aneurysm without rupture.  EXAM: CT ANGIOGRAPHY CHEST WITH CONTRAST  TECHNIQUE: Multidetector CT imaging of the chest was performed using the standard protocol during bolus administration of intravenous contrast. Multiplanar CT image reconstructions and MIPs were obtained to evaluate the vascular anatomy.  CONTRAST:  75mL ISOVUE-370 IOPAMIDOL (ISOVUE-370) INJECTION 76%  COMPARISON:  CT scan of November 11, 2016.  FINDINGS: Cardiovascular: Stable 4.7 cm ascending thoracic  aortic aneurysm is noted. No dissection is noted. 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 Great vessels are widely patent without significant stenosis. Transverse aortic arch measures 3.1 cm which is within normal limits. Proximal descending thoracic aorta measures 3.0 cm. Normal cardiac size. No pericardial effusion is noted.  Mediastinum/Nodes: Small sliding-type hiatal hernia. No significant adenopathy is noted. Thyroid gland is unremarkable.  Lungs/Pleura: Lungs are clear. No pleural effusion or pneumothorax.  Upper Abdomen: No acute abnormality.  Musculoskeletal: No chest wall  abnormality. No acute or significant osseous findings.  Review of the MIP images confirms the above findings.  IMPRESSION: Stable 4.7 cm ascending thoracic aortic aneurysm. 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 Ascending thoracic aortic aneurysm. Recommend semi-annual imaging followup by CTA or MRA and referral to cardiothoracic surgery if not already obtained. This recommendation follows 2010 ACCF/AHA/AATS/ACR/ASA/SCA/SCAI/SIR/STS/SVM Guidelines for the Diagnosis and Management of Patients With Thoracic Aortic Disease. Circulation. 2010; 121: Y403-K742.   Electronically Signed   By: Lupita Raider, M.D.   On: 07/26/2017 09:49   Impression:  She has a stable 4.7 cm fusiform ascending aortic aneurysm.  She has a history of mild to moderate aortic insufficiency on her last echocardiogram in August 2018.  She is scheduled for another echo in July 2019.  She is felt to have a trileaflet aortic valve on her prior echocardiograms although the valve detail was difficult to see for me.  That does have some bearing on the recommendation for surgical repair of her ascending aortic aneurysm.  If she has a trileaflet aortic valve then I would tend to wait until it is close to 5.5 cm but if there is a bicuspid valve I would recommend repairing it sooner, possibly if there is any further enlargement and certainly if it reached 5 cm.  I reviewed the CT images with her today and answered her questions.  I stressed the importance of good blood pressure control.  Blood pressure is elevated today but she said she did not take her medications this morning.  I asked her to get a blood pressure cuff and check her blood pressure periodically at home so that we have a better idea of where her blood pressure usually runs.  She is not currently on a beta-blocker but may benefit from being on one.  Plan:  She has a follow-up appointment this summer with Dr.  Tresa Endo and will have a follow-up 2D echo in July.  I will plan to see her back in 6 months with a gated cardiac CTA since this will give [place] more detail of her aortic valve.   I spent 15 minutes performing this established patient evaluation and > 50% of this time was spent face to face counseling and coordinating the care of this patient's aortic aneurysm.    Alleen Borne, MD [facility] [phone]

## 2017-08-09 DIAGNOSIS — Z0000 Encounter for general adult medical examination without abnormal findings: Secondary | ICD-10-CM | POA: Diagnosis not present

## 2017-08-09 DIAGNOSIS — E039 Hypothyroidism, unspecified: Secondary | ICD-10-CM | POA: Diagnosis not present

## 2017-08-09 DIAGNOSIS — N390 Urinary tract infection, site not specified: Secondary | ICD-10-CM | POA: Diagnosis not present

## 2017-08-25 DIAGNOSIS — F329 Major depressive disorder, single episode, unspecified: Secondary | ICD-10-CM | POA: Diagnosis not present

## 2017-08-25 DIAGNOSIS — F900 Attention-deficit hyperactivity disorder, predominantly inattentive type: Secondary | ICD-10-CM | POA: Diagnosis not present

## 2017-08-25 DIAGNOSIS — E039 Hypothyroidism, unspecified: Secondary | ICD-10-CM | POA: Diagnosis not present

## 2017-08-25 DIAGNOSIS — Z0000 Encounter for general adult medical examination without abnormal findings: Secondary | ICD-10-CM | POA: Diagnosis not present

## 2017-08-25 DIAGNOSIS — J309 Allergic rhinitis, unspecified: Secondary | ICD-10-CM | POA: Diagnosis not present

## 2017-08-25 DIAGNOSIS — K219 Gastro-esophageal reflux disease without esophagitis: Secondary | ICD-10-CM | POA: Diagnosis not present

## 2017-08-25 DIAGNOSIS — F419 Anxiety disorder, unspecified: Secondary | ICD-10-CM | POA: Diagnosis not present

## 2017-08-25 DIAGNOSIS — M797 Fibromyalgia: Secondary | ICD-10-CM | POA: Diagnosis not present

## 2017-09-14 MED FILL — AMPHETAMINE SALTS 15 MG TAB: 15 | 30 days supply | Qty: 60 | Fill #0

## 2017-09-14 MED FILL — KETOROLAC 10 MG TABLET: 10 | 90 days supply | Qty: 180 | Fill #0

## 2017-09-14 MED FILL — FLUoxetine HCL 40 MG CAPS: 40 | 90 days supply | Qty: 90 | Fill #0

## 2017-09-14 MED FILL — VENTOLIN HFA 90 MCG INHALER: 108 (90 BAS | 17 days supply | Qty: 18 | Fill #0

## 2017-09-14 MED FILL — MONTELUKAST SOD 10 MG TAB: 10 | 90 days supply | Qty: 90 | Fill #0

## 2017-09-14 MED FILL — ALPRAZolam 0.5 MG TABS: 0.5 | 90 days supply | Qty: 90 | Fill #0

## 2017-09-14 MED FILL — LEVOTHYROXINE 50 MCG TABLET: 50 | 84 days supply | Qty: 90 | Fill #0

## 2017-09-14 MED FILL — IRBESARTAN 300 MG TABLET: 300 | 90 days supply | Qty: 90 | Fill #0

## 2017-09-14 MED FILL — PANTOPRAZOLE SOD DR 40 MG T: 40 | 90 days supply | Qty: 90 | Fill #0

## 2017-09-26 ENCOUNTER — Ambulatory Visit (HOSPITAL_COMMUNITY): Payer: 59 | Attending: Cardiology

## 2017-09-26 ENCOUNTER — Other Ambulatory Visit: Payer: Self-pay

## 2017-09-26 DIAGNOSIS — R011 Cardiac murmur, unspecified: Secondary | ICD-10-CM | POA: Insufficient documentation

## 2017-09-26 DIAGNOSIS — I341 Nonrheumatic mitral (valve) prolapse: Secondary | ICD-10-CM | POA: Diagnosis not present

## 2017-09-26 DIAGNOSIS — I351 Nonrheumatic aortic (valve) insufficiency: Secondary | ICD-10-CM | POA: Insufficient documentation

## 2017-09-26 DIAGNOSIS — E079 Disorder of thyroid, unspecified: Secondary | ICD-10-CM | POA: Insufficient documentation

## 2017-11-24 ENCOUNTER — Encounter: Payer: Self-pay | Admitting: Surgery

## 2017-12-21 ENCOUNTER — Other Ambulatory Visit: Payer: Self-pay | Admitting: *Deleted

## 2017-12-21 DIAGNOSIS — I351 Nonrheumatic aortic (valve) insufficiency: Secondary | ICD-10-CM

## 2018-01-03 ENCOUNTER — Ambulatory Visit (HOSPITAL_COMMUNITY): Payer: 59

## 2018-01-08 ENCOUNTER — Ambulatory Visit (HOSPITAL_COMMUNITY)
Admission: RE | Admit: 2018-01-08 | Discharge: 2018-01-08 | Disposition: A | Discharge status: Home / Self Care | Payer: 59 | Source: Ambulatory Visit | Attending: Surgery | Admitting: Surgery

## 2018-01-08 DIAGNOSIS — I351 Nonrheumatic aortic (valve) insufficiency: Secondary | ICD-10-CM | POA: Diagnosis not present

## 2018-01-08 DIAGNOSIS — I712 Thoracic aortic aneurysm, without rupture: Secondary | ICD-10-CM | POA: Diagnosis not present

## 2018-01-08 MED ORDER — METOPROLOL TARTRATE 5 MG/5ML IV SOLN
INTRAVENOUS | Status: AC
  Filled 2018-01-08: qty 5

## 2018-01-08 MED ORDER — NITROGLYCERIN 0.4 MG SL SUBL
SUBLINGUAL_TABLET | SUBLINGUAL | Status: AC
  Filled 2018-01-08: qty 2

## 2018-01-08 MED ORDER — NITROGLYCERIN 0.4 MG SL SUBL
0.8000 mg | SUBLINGUAL_TABLET | Freq: Once | SUBLINGUAL | Status: AC
  Administered 2018-01-08: 0.8 mg via SUBLINGUAL

## 2018-01-08 MED ORDER — METOPROLOL TARTRATE 5 MG/5ML IV SOLN
10.0000 mg | INTRAVENOUS | Status: DC | PRN
  Administered 2018-01-08: 10 mg via INTRAVENOUS

## 2018-01-08 MED ORDER — IOPAMIDOL (ISOVUE-370) INJECTION 76%
100.0000 mL | Freq: Once | INTRAVENOUS | Status: AC | PRN
  Administered 2018-01-08: 100 mL via INTRAVENOUS

## 2018-01-08 NOTE — Progress Notes (Signed)
CT complete. Patient complains of headache. Offered patient drink, patient states she will get a drink when she leaves.

## 2018-01-20 IMAGING — CT CT ANGIO CHEST
2 of 9 series · 19 of 46 positions shown · IV contrast (isovue)
Comparison: Radiographs July 20, 2013.

CLINICAL DATA: Ascending aortic dilatation.

EXAM:
CT ANGIOGRAPHY CHEST WITH CONTRAST
TECHNIQUE: Multidetector CT imaging of the chest was performed using the
standard protocol during bolus administration of intravenous
contrast. Multiplanar CT image reconstructions and MIPs were
obtained to evaluate the vascular anatomy.
CONTRAST:  100 mL of Isovue 370 intravenously.

[Series 6: aorta 3.0 i31f 2 · axial · 0.68mm/px · z∈[-290,-12]mm · 16 of 101 slices shown]
[im 4/101  lung]
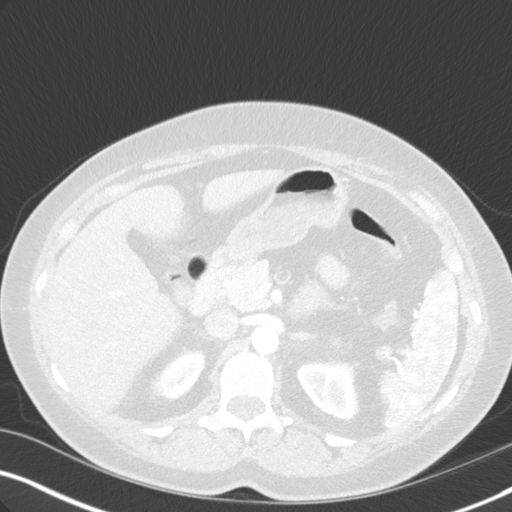
[im 12/101  soft-tissue]
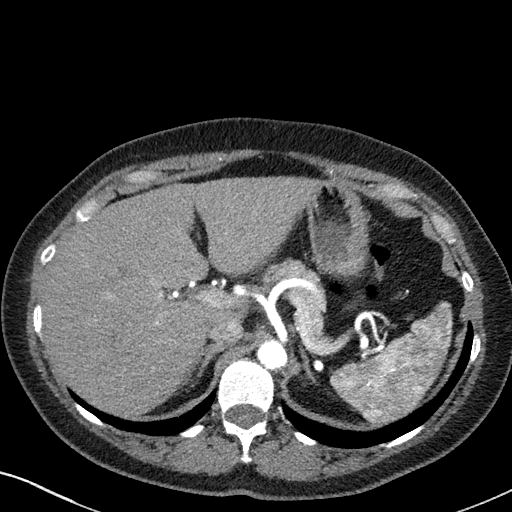
[im 19/101  lung]
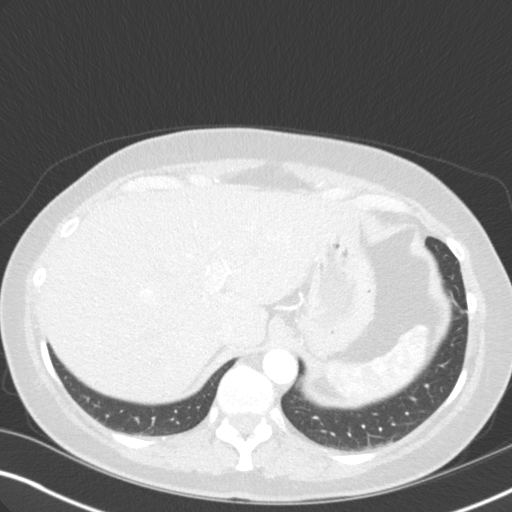
[im 23/101  soft-tissue]
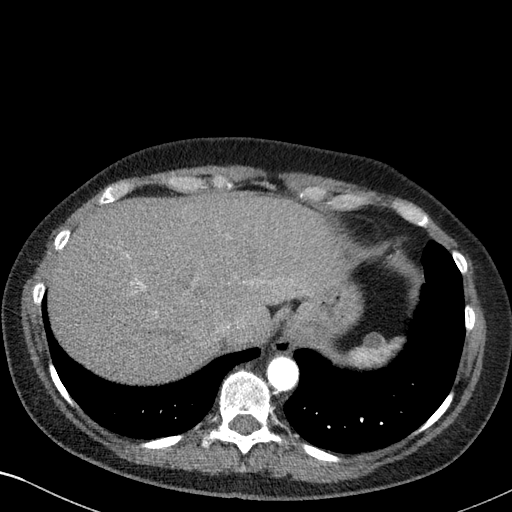
[im 30/101  lung]
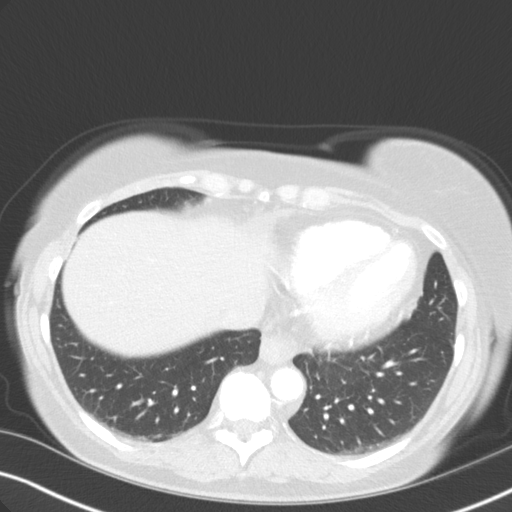
[im 34/101  soft-tissue]
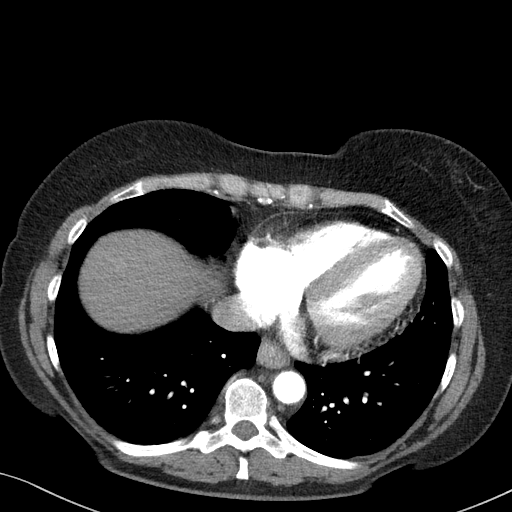
[im 41/101  lung]
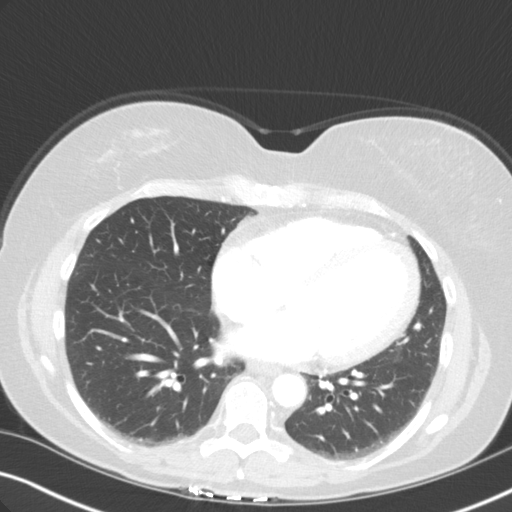
[im 49/101  soft-tissue]
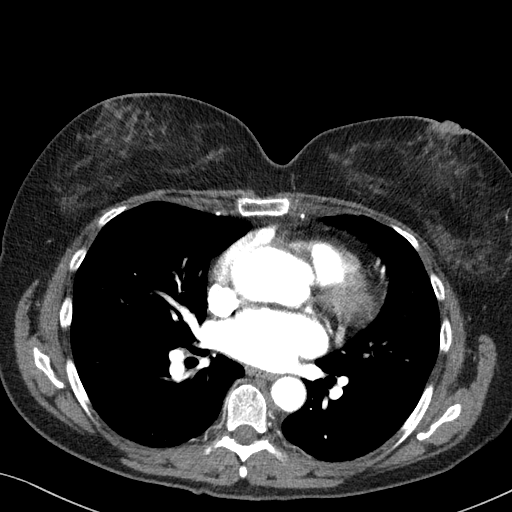
[im 52/101  lung]
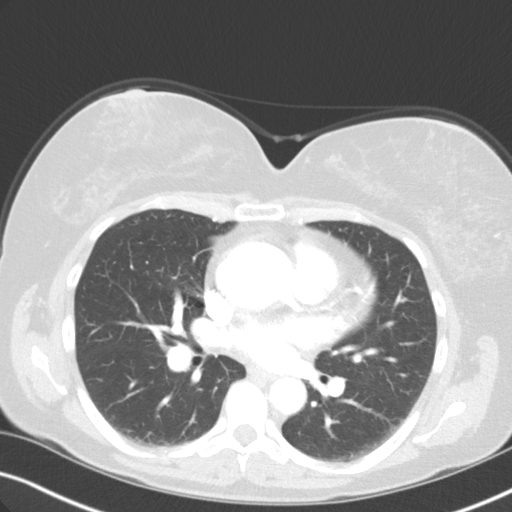
[im 60/101  soft-tissue]
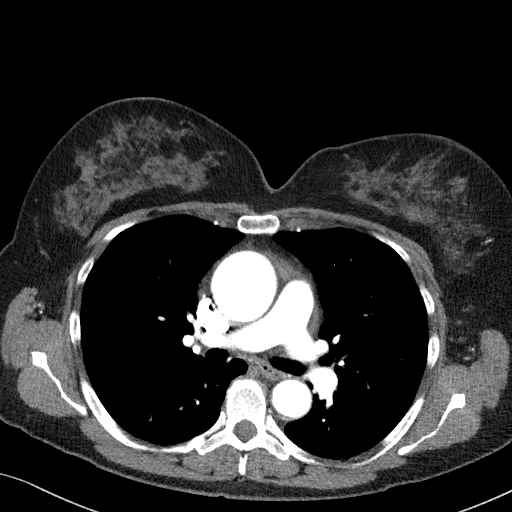
[im 67/101  lung]
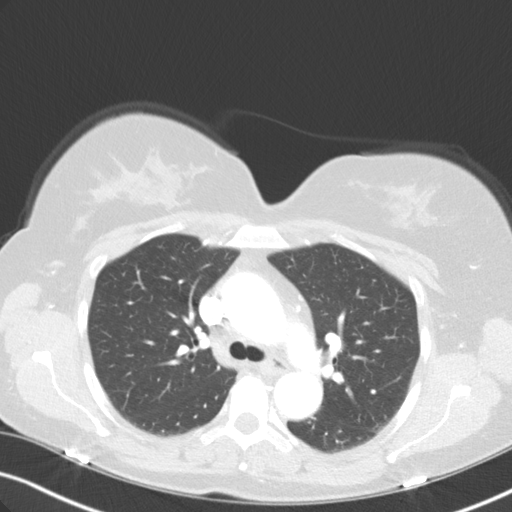
[im 71/101  soft-tissue]
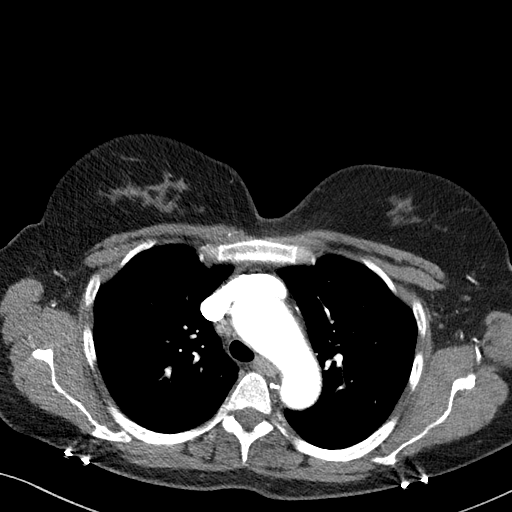
[im 78/101  lung]
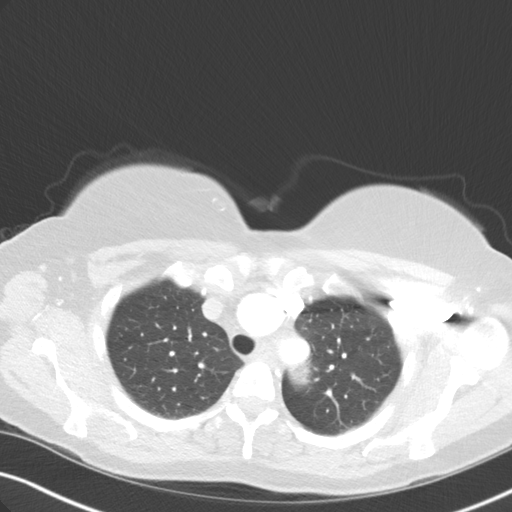
[im 82/101  soft-tissue]
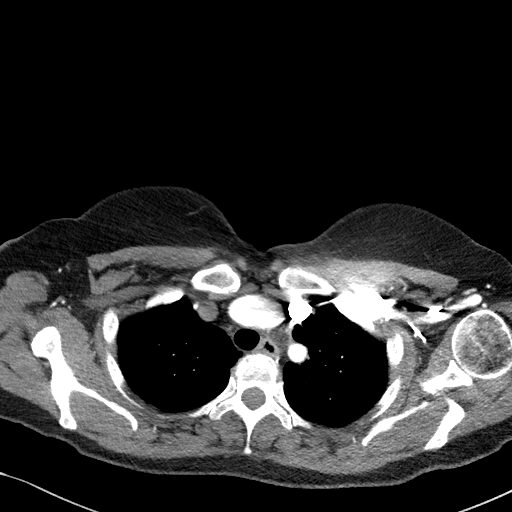
[im 89/101  lung]
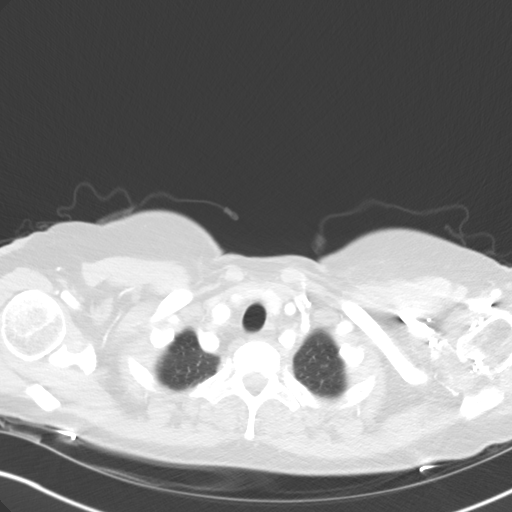
[im 97/101  soft-tissue]
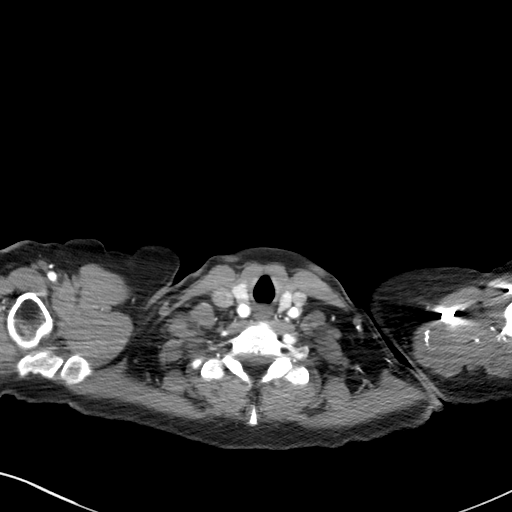

[Series 9: coronals · coronal · 0.62mm/px · 3 of 113 slices shown]
[im 29/113  soft-tissue]
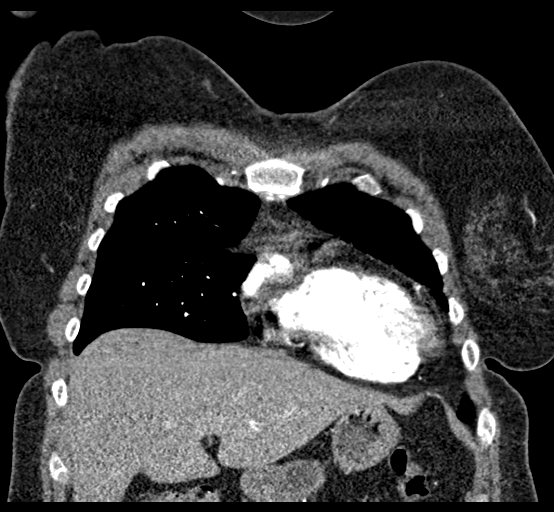
[im 57/113  soft-tissue]
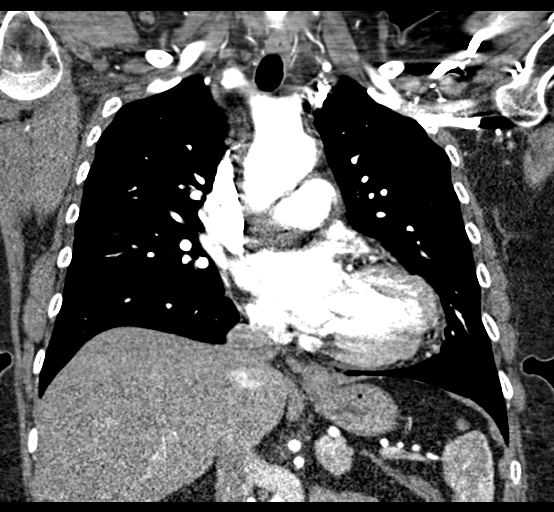
[im 85/113  soft-tissue]
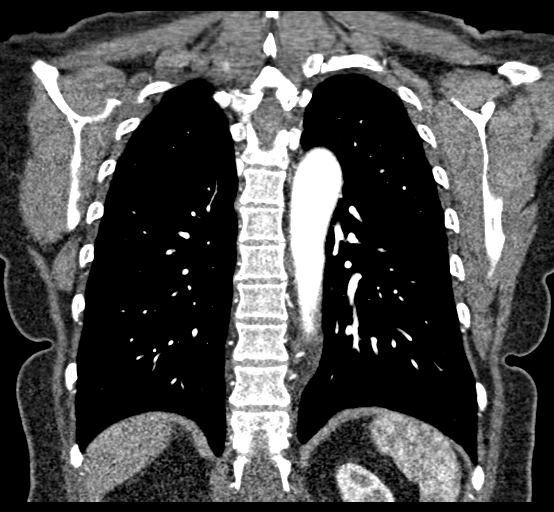

[19 of 46 positions shown; findings below may reference images not displayed]

FINDINGS: Cardiovascular: 4.7 cm ascending thoracic aortic aneurysm is noted.
No dissection is noted. Transverse aortic arch measures 3.0 cm.
Proximal descending thoracic aorta measures 2.9 cm. Great vessels
are widely patent, with common origin of right innominate and left
common carotid arteries. No pericardial effusion.

Mediastinum/Nodes: No enlarged mediastinal, hilar, or axillary lymph
nodes. Thyroid gland, trachea, and esophagus demonstrate no
significant findings.

Lungs/Pleura: Lungs are clear. No pleural effusion or pneumothorax.

Upper Abdomen: No acute abnormality.

Musculoskeletal: No chest wall abnormality. No acute or significant
osseous findings.

Review of the MIP images confirms the above findings.
IMPRESSION: 4.7 cm ascending thoracic aortic aneurysm. Recommend semi-annual
imaging followup by CTA or MRA and referral to cardiothoracic
surgery if not already obtained. This recommendation follows 1181
ACCF/AHA/AATS/ACR/ASA/SCA/RIHONGA/ANSELMO/FORSON/ABOU CHRIS Guidelines for the
Diagnosis and Management of Patients With Thoracic Aortic Disease.
Circulation. 1181; 121: [id number].

## 2018-01-31 ENCOUNTER — Ambulatory Visit: Payer: 59 | Admitting: Surgery

## 2018-02-07 ENCOUNTER — Ambulatory Visit: Payer: 59 | Admitting: Surgery

## 2018-02-07 ENCOUNTER — Other Ambulatory Visit: Payer: Self-pay

## 2018-02-07 ENCOUNTER — Encounter: Payer: Self-pay | Admitting: Surgery

## 2018-02-07 VITALS — BP 108/80 | HR 87 | Resp 16 | Ht 64.5 in | Wt 168.0 lb

## 2018-02-07 DIAGNOSIS — I712 Thoracic aortic aneurysm, without rupture, unspecified: Secondary | ICD-10-CM

## 2018-02-07 DIAGNOSIS — I351 Nonrheumatic aortic (valve) insufficiency: Secondary | ICD-10-CM | POA: Diagnosis not present

## 2018-02-07 NOTE — Progress Notes (Signed)
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 She has been feeling well since I last saw her with no chest or back pain.  She denies any shortness of breath.  Current Outpatient Medications  Medication Sig Dispense Refill  . albuterol (PROVENTIL HFA;VENTOLIN HFA) 108 (90 Base) MCG/ACT inhaler Inhale 2 puffs into the lungs every 6 (six) hours as needed for wheezing or shortness of breath.    . ALPRAZolam (XANAX) 0.5 MG tablet Take 0.5 mg by mouth at bedtime as needed for anxiety.    Marland Kitchen amphetamine-dextroamphetamine (ADDERALL) 15 MG tablet Take by mouth daily. Pt takes 0.5 tablet    . esomeprazole (NEXIUM) 20 MG capsule Take 20 mg by mouth daily at 12 noon.    Marland Kitchen FLUoxetine (PROZAC) 40 MG capsule   4  . irbesartan (AVAPRO) 300 MG tablet Take 1 tablet (300 mg total) by mouth daily. 90 tablet 3  . ketorolac (TORADOL) 10 MG tablet as needed.  0  . levothyroxine (SYNTHROID, LEVOTHROID) 50 MCG tablet Take 50 mcg by mouth daily before breakfast. Takes additional 1.5 on sundays    . lidocaine (LIDODERM) 5 % Place 1 patch onto the skin daily. Remove & Discard patch within 12 hours or as directed by MD    . liothyronine (CYTOMEL) 5 MCG tablet Take 5 mcg by mouth daily.    Marland Kitchen loratadine (CLARITIN) 10 MG tablet Take 10 mg by mouth daily.    . methocarbamol (ROBAXIN) 500 MG tablet Take 500 mg by mouth 4 (four) times daily.    . montelukast (SINGULAIR) 10 MG tablet Take 10 mg by mouth at bedtime.    . Multiple Vitamin (MULTIVITAMIN) tablet Take 1 tablet by mouth daily.    . traMADol (ULTRAM) 50 MG tablet Take 50 mg by mouth every 6 (six) hours as needed.     No current facility-administered medications for this visit.      Physical Exam: BP 108/80 (BP Location: Left Arm, Patient Position: Sitting, Cuff Size: Large)   Pulse 87   Resp 16   Ht 5' 4.5" (1.638 m)   Wt 168 lb (76.2 kg)   SpO2 95% Comment: ON RA  BMI  28.39 kg/m  She looks well. Cardiac exam shows a regular rate and rhythm with a 2/6 diastolic murmur along the left lower sternal border. Lungs are clear. There is no peripheral edema.  Diagnostic Tests:  ADDENDUM REPORT: 01/08/2018 15:51  CLINICAL DATA:  52 year old female with known ascending aortic aneurysm.  EXAM: Cardiac/Coronary  CT  COMPARISON:  Chest CTA from 07/26/2017.  TECHNIQUE: The patient was scanned on a [employer].  FINDINGS: A 120 kV prospective scan was triggered in the descending thoracic aorta at 111 HU's. Axial non-contrast 3 mm slices were carried out through the heart. The data set was analyzed on a dedicated work station and scored using the Agatson method. Gantry rotation speed was 250 msecs and collimation was .6 mm. No beta blockade and 0.8 mg of sl NTG was given. The 3D data set was reconstructed in 5% intervals of the 67-82 % of the R-R cycle. Diastolic phases were analyzed on a dedicated work station using MPR, MIP and VRT modes. The patient received 80 cc of contrast.  Aorta: There is aneurysmal dilatation of the ascending aorta, normal size of the sinotubular junction and descending aorta. There is no atheroma or calcifications in the thoracic aorta.  Sinotubular  Junction: 34 x 34 mm  Ascending Thoracic Aorta: 45 x 44 mm  Aortic Arch: Not visualized.  Descending Thoracic Aorta: 24 x 23 mm  Aortic Valve is trileaflet with moderately increased leaflet thickness and no calcifications. There is normal size of the sinuses and sinotubular junction.  Sinus of Valsalva Measurements:  Non-coronary: 29 mm  Right -coronary: 31 mm  Left -coronary: 29 mm  Coronary Arteries:  Normal coronary origin.  Right dominance.  RCA is a large dominant artery that gives rise to PDA and PLVB. There is no plaque.  Left main is a large artery that gives rise to LAD and LCX arteries.  LAD is a large vessel that gives  rise to two diagonal arteries and has no plaque.  LCX is a large non-dominant artery that gives rise to two large OM branches. There is no plaque.  Other findings:  Normal pulmonary vein drainage into the left atrium.  Normal let atrial appendage without a thrombus.  Normal size of the pulmonary artery.  IMPRESSION: 1. Coronary calcium score of 0. This was 0 percentile for age and sex matched control. Normal coronary origin with right dominance. Coronary arteries are large and have no CAD.  2. Aortic Valve is trileaflet with moderately increased leaflet thickness and no calcifications. There is normal size of the sinuses.  3. There is aneurysmal dilatation of the ascending aorta with maximum diameter 45 mm. This is stable when compared to the prior study from 07/26/2017. The measurement at that study was obtained from axial view, not double oblique view and was therefore overestimated.   Electronically Signed   By: Tobias Alexander   On: 01/08/2018 15:51   Addended by Lars Masson, MD on 01/08/2018 3:54 PM    Study Result   EXAM: OVER-READ INTERPRETATION  CT CHEST  The following report is an over-read performed by radiologist Dr. Trudie Reed of [facility] on 01/08/2018. This over-read does not include interpretation of cardiac or coronary anatomy or pathology. The coronary calcium score/coronary CTA interpretation by the cardiologist is attached.  COMPARISON:  None.  FINDINGS: Mild aneurysmal dilatation of ascending thoracic aorta (4.5 cm in diameter). Within the visualized portions of the thorax there are no suspicious appearing pulmonary nodules or masses, there is no acute consolidative airspace disease, no pleural effusions, no pneumothorax and no lymphadenopathy. Small hiatal hernia. Visualized portions of the upper abdomen are unremarkable. There are no aggressive appearing lytic or blastic lesions noted in  the visualized portions of the skeleton.  IMPRESSION: 1. Mild aneurysmal dilatation of the ascending thoracic aorta (4.5 cm in diameter). Recommend semi-annual imaging followup by CTA or MRA and referral to cardiothoracic surgery if not already obtained. This recommendation follows 2010 ACCF/AHA/AATS/ACR/ASA/SCA/SCAI/SIR/STS/SVM Guidelines for the Diagnosis and Management of Patients With Thoracic Aortic Disease. Circulation. 2010; 121: Z610-R604. 2. Small hiatal hernia.  Electronically Signed: By: Trudie Reed M.D. On: 01/08/2018 14:29       Result status: Final result                           *[facility]*                        [address]                        [place], [place] [address]  [phone]  ------------------------------------------------------------------- Echocardiography  Patient:    Saraya, Tirey MR #:       [id number] Study Date: 09/26/2017 Gender:     F Age:        52 Height:     163.8 cm Weight:     79.1 kg BSA:        1.92 m^2 Pt. Status: Room:   ATTENDING    Nicki Guadalajara, M.D.  ORDERING     Nicki Guadalajara, M.D.  REFERRING    Nicki Guadalajara, M.D.  SONOGRAPHER  Aida Raider, RDCS  PERFORMING   [facility], Outpatient  cc:  ------------------------------------------------------------------- LV EF: 60% -   65%  ------------------------------------------------------------------- Indications:      I35.9 Aortic Valve Disorder.  ------------------------------------------------------------------- History:   PMH:  Acquired from the patient and from the patient&'s chart.  PMH:  Murmur. Thyroid disease.  ------------------------------------------------------------------- Study Conclusions  - Left ventricle: The cavity size was normal. Systolic function was   normal. The estimated ejection fraction was in the range of 60%   to 65%. Wall motion was normal; there were no regional wall   motion  abnormalities. There was an increased relative   contribution of atrial contraction to ventricular filling.   Doppler parameters are consistent with abnormal left ventricular   relaxation (grade 1 diastolic dysfunction). - Aortic valve: There was moderate regurgitation. - Aorta: Ascending aortic diameter: 45 mm (S). - Ascending aorta: The ascending aorta was mildly dilated. - Mitral valve: Trivial, late systolicprolapse, involving the   anterior leaflet. - Left atrium: The atrium was mildly dilated. - Tricuspid valve: There was trivial regurgitation. - Pulmonic valve: There was trivial regurgitation.  Impressions:  - Normal LVF, mild LAE, grade 1 DD, trivial MVP of AMVL, moderately   dilated ascending aorta at 45mm (46mm on prior echo), mild AVSC   with moderate AI. Compared to prior echo, the ascending aorta   diameter is essentially unchanged and AI has progressed to   moderate. Consider chest CT angio of cardiac MRI for further   evaluation dilated aorta.  ------------------------------------------------------------------- Study data:   Study status:  Routine.  Procedure:  The patient reported no pain pre or post test. Transthoracic echocardiography for left ventricular function evaluation, for right ventricular function evaluation, and for assessment of valvular function. Image quality was adequate.  Study completion:  There were no complications.          Echocardiography.  M-mode, complete 2D, spectral Doppler, and color Doppler.  Birthdate:  Patient birthdate: 30-Apr-1965.  Age:  Patient is 52 yr old.  Sex:  Gender: female.    BMI: 29.5 kg/m^2.  Blood pressure:     142/90  Patient status:  Outpatient.  Study date:  Study date: 09/26/2017. Study time: 09:26 AM.  Location:  [facility]  -------------------------------------------------------------------  ------------------------------------------------------------------- Left ventricle:  The cavity size was  normal. Systolic function was normal. The estimated ejection fraction was in the range of 60% to 65%. Wall motion was normal; there were no regional wall motion abnormalities. There was an increased relative contribution of atrial contraction to ventricular filling. Doppler parameters are consistent with abnormal left ventricular relaxation (grade 1 diastolic dysfunction).  ------------------------------------------------------------------- Aortic valve:  Poorly visualized.  Trileaflet; mildly thickened, mildly calcified leaflets. Mobility was not restricted.  Doppler: Transvalvular velocity was within the normal range. There was no stenosis. There was moderate regurgitation.    VTI ratio of LVOT to aortic valve: 0.41. Valve area (VTI): 1.41 cm^2. Indexed valve area (  VTI): 0.73 cm^2/m^2. Peak velocity ratio of LVOT to aortic valve: 0.44. Valve area (Vmax): 1.51 cm^2. Indexed valve area (Vmax): 0.79 cm^2/m^2. Mean velocity ratio of LVOT to aortic valve: 0.48. Valve area (Vmean): 1.67 cm^2. Indexed valve area (Vmean): 0.87 cm^2/m^2.    Mean gradient (S): 11 mm Hg. Peak gradient (S): 20 mm Hg.  ------------------------------------------------------------------- Aorta:  Aortic root: The aortic root was normal in size. Ascending aorta: The ascending aorta was mildly dilated.  ------------------------------------------------------------------- Mitral valve:  Mobility was not restricted.  Trivial, late systolicprolapse, involving the anterior leaflet.  Doppler: Transvalvular velocity was within the normal range. There was no evidence for stenosis. There was no regurgitation.  ------------------------------------------------------------------- Left atrium:  The atrium was mildly dilated.  ------------------------------------------------------------------- Right ventricle:  The cavity size was normal. Wall thickness was normal. Systolic function was  normal.  ------------------------------------------------------------------- Pulmonic valve:    Structurally normal valve.   Cusp separation was normal.  Doppler:  Transvalvular velocity was within the normal range. There was no evidence for stenosis. There was trivial regurgitation.  ------------------------------------------------------------------- Tricuspid valve:   Structurally normal valve.    Doppler: Transvalvular velocity was within the normal range. There was trivial regurgitation.  ------------------------------------------------------------------- Pulmonary artery:   The main pulmonary artery was normal-sized. Systolic pressure was within the normal range.  ------------------------------------------------------------------- Right atrium:  The atrium was normal in size.  ------------------------------------------------------------------- Pericardium:  There was no pericardial effusion.  ------------------------------------------------------------------- Systemic veins: Inferior vena cava: The vessel was normal in size.  ------------------------------------------------------------------- Measurements   Left ventricle                            Value          Reference  LV ID, ED, PLAX chordal                   49.6  mm       43 - 52  LV ID, ES, PLAX chordal                   28.7  mm       23 - 38  LV fx shortening, PLAX chordal            42    %        >=29  LV PW thickness, ED                       11.1  mm       ---------  IVS/LV PW ratio, ED                       0.96           <=1.3  Stroke volume, 2D                         63    ml       ---------  Stroke volume/bsa, 2D                     33    ml/m^2   ---------  LV e&', lateral                            7.68  cm/s     ---------  LV E/e&', lateral  9.13           ---------  LV e&', medial                             6.25  cm/s     ---------  LV E/e&', medial                            11.22          ---------  LV e&', average                            6.97  cm/s     ---------  LV E/e&', average                          10.06          ---------    Ventricular septum                        Value          Reference  IVS thickness, ED                         10.7  mm       ---------    LVOT                                      Value          Reference  LVOT ID, S                                21    mm       ---------  LVOT area                                 3.46  cm^2     ---------  LVOT ID                                   21    mm       ---------  LVOT peak velocity, S                     98.5  cm/s     ---------  LVOT mean velocity, S                     74    cm/s     ---------  LVOT VTI, S                               18.1  cm       ---------  LVOT peak gradient, S                     4     mm Hg    ---------  Stroke volume (SV), LVOT DP  62.7  ml       ---------  Stroke index (SV/bsa), LVOT DP            32.6  ml/m^2   ---------    Aortic valve                              Value          Reference  Aortic valve peak velocity, S             226   cm/s     ---------  Aortic valve mean velocity, S             153   cm/s     ---------  Aortic valve VTI, S                       44.5  cm       ---------  Aortic mean gradient, S                   11    mm Hg    ---------  Aortic peak gradient, S                   20    mm Hg    ---------  VTI ratio, LVOT/AV                        0.41           ---------  Aortic valve area, VTI                    1.41  cm^2     ---------  Aortic valve area/bsa, VTI                0.73  cm^2/m^2 ---------  Velocity ratio, peak, LVOT/AV             0.44           ---------  Aortic valve area, peak velocity          1.51  cm^2     ---------  Aortic valve area/bsa, peak               0.79  cm^2/m^2 ---------  velocity  Velocity ratio, mean, LVOT/AV             0.48           ---------  Aortic valve area, mean  velocity          1.67  cm^2     ---------  Aortic valve area/bsa, mean               0.87  cm^2/m^2 ---------  velocity  Aortic regurg pressure half-time          370   ms       ---------    Aorta                                     Value          Reference  Aortic root ID, ED                        34    mm       ---------  Ascending aorta ID, A-P, S                45    mm       ---------    Left atrium                               Value          Reference  LA ID, A-P, ES                            45    mm       ---------  LA ID/bsa, A-P                    (H)     2.34  cm/m^2   <=2.2  LA volume, S                              46    ml       ---------  LA volume/bsa, S                          23.9  ml/m^2   ---------  LA volume, ES, 1-p A4C                    37    ml       ---------  LA volume/bsa, ES, 1-p A4C                19.3  ml/m^2   ---------  LA volume, ES, 1-p A2C                    52    ml       ---------  LA volume/bsa, ES, 1-p A2C                27.1  ml/m^2   ---------    Mitral valve                              Value          Reference  Mitral E-wave peak velocity               70.1  cm/s     ---------  Mitral A-wave peak velocity               93.3  cm/s     ---------  Mitral deceleration time          (H)     243   ms       150 - 230  Mitral E/A ratio, peak                    0.8            ---------    Pulmonary arteries                        Value          Reference  PA pressure, S, DP                        17  mm Hg    <=30    Tricuspid valve                           Value          Reference  Tricuspid regurg peak velocity            187   cm/s     ---------  Tricuspid peak RV-RA gradient             14    mm Hg    ---------    Right ventricle                           Value          Reference  RV s&', lateral, S                         16.9  cm/s     ---------  Legend: (L)  and  (H)  mark values outside specified reference  range.  ------------------------------------------------------------------- Prepared and Electronically Authenticated by  Armanda Magicraci Turner, MD 2019-07-30T09:36:03     Impression:  This 52 year old woman has a stable 4.5 cm fusiform ascending aortic aneurysm by gated cardiac CTA.  Her previous CTA of the chest in May 2019 showed the aneurysm to measure 4.7 cm in the axial view but I think the 4.5 cm diameter is more accurate in the double oblique view of her current scan.  It is still well below the surgical threshold of 5.5 cm.  She also has moderate aortic insufficiency which has not changed significantly from her prior echocardiogram.  Her left ventricular internal dimensions are within normal limits and left ventricular systolic function is normal.  She remains asymptomatic.  She has a trileaflet aortic valve with normal sinus of Valsalva dimensions.  I do not think there is any indication for surgical treatment at this time.  I reviewed the CT and echo exams with her and answered her questions.  I reviewed the signs and symptoms of symptomatic aortic insufficiency.  I will plan to see her back in 1 year with a CTA of the chest and I think she should have an echocardiogram repeated in about 6 to 12 months.  Plan:  I will see her back in 1 year with a CTA of the chest.  She will continue to follow-up with Dr. Tresa EndoKelly for her aortic insufficiency and he will decide when to repeat her echocardiogram.  I spent 15 minutes performing this established patient evaluation and > 50% of this time was spent face to face counseling and coordinating the care of this patient's aortic aneurysm.    Alleen BorneBryan K Bartle, MD [facility] [phone]

## 2018-02-19 DIAGNOSIS — E039 Hypothyroidism, unspecified: Secondary | ICD-10-CM | POA: Diagnosis not present

## 2018-02-19 DIAGNOSIS — Z79899 Other long term (current) drug therapy: Secondary | ICD-10-CM | POA: Diagnosis not present

## 2018-02-26 DIAGNOSIS — F900 Attention-deficit hyperactivity disorder, predominantly inattentive type: Secondary | ICD-10-CM | POA: Diagnosis not present

## 2018-02-26 DIAGNOSIS — F329 Major depressive disorder, single episode, unspecified: Secondary | ICD-10-CM | POA: Diagnosis not present

## 2018-02-26 DIAGNOSIS — K219 Gastro-esophageal reflux disease without esophagitis: Secondary | ICD-10-CM | POA: Diagnosis not present

## 2018-02-26 DIAGNOSIS — Z79899 Other long term (current) drug therapy: Secondary | ICD-10-CM | POA: Diagnosis not present

## 2018-02-26 DIAGNOSIS — F419 Anxiety disorder, unspecified: Secondary | ICD-10-CM | POA: Diagnosis not present

## 2018-02-26 DIAGNOSIS — E039 Hypothyroidism, unspecified: Secondary | ICD-10-CM | POA: Diagnosis not present

## 2018-02-26 DIAGNOSIS — M797 Fibromyalgia: Secondary | ICD-10-CM | POA: Diagnosis not present

## 2018-02-26 MED FILL — LEVOTHYROXINE 50 MCG TABLET: 50 | 90 days supply | Qty: 96 | Fill #0

## 2018-02-26 MED FILL — METHOCARBAMOL 500 MG TABLET: 500 | 90 days supply | Qty: 360 | Fill #0

## 2018-02-26 MED FILL — FLUoxetine HCL 40 MG CAPS: 40 | 90 days supply | Qty: 90 | Fill #0

## 2018-02-26 MED FILL — PANTOPRAZOLE SOD DR 40 MG T: 40 | 90 days supply | Qty: 90 | Fill #0

## 2018-02-26 MED FILL — LIOTHYRONINE SODIUM 5 MCG T: 5 | 90 days supply | Qty: 180 | Fill #0

## 2018-02-26 MED FILL — KETOROLAC 10 MG TABLET: 10 | 90 days supply | Qty: 180 | Fill #0

## 2018-03-02 MED FILL — AMPHETAMINE-DEXTROAMPHETAMI: 15 | 30 days supply | Qty: 60 | Fill #0

## 2018-03-02 MED FILL — ALPRAZolam 0.5 MG TABS: 0.5 | 30 days supply | Qty: 30 | Fill #0

## 2018-03-02 MED FILL — traMADol HCL 50 MG TABS: 50 | 30 days supply | Qty: 120 | Fill #0

## 2018-04-14 DIAGNOSIS — S93601A Unspecified sprain of right foot, initial encounter: Secondary | ICD-10-CM | POA: Diagnosis not present

## 2018-09-03 DIAGNOSIS — E039 Hypothyroidism, unspecified: Secondary | ICD-10-CM | POA: Diagnosis not present

## 2018-09-03 DIAGNOSIS — Z0000 Encounter for general adult medical examination without abnormal findings: Secondary | ICD-10-CM | POA: Diagnosis not present

## 2018-09-03 DIAGNOSIS — I10 Essential (primary) hypertension: Secondary | ICD-10-CM | POA: Diagnosis not present

## 2018-09-03 DIAGNOSIS — E785 Hyperlipidemia, unspecified: Secondary | ICD-10-CM | POA: Diagnosis not present

## 2018-09-12 DIAGNOSIS — Z0000 Encounter for general adult medical examination without abnormal findings: Secondary | ICD-10-CM | POA: Diagnosis not present

## 2018-09-12 DIAGNOSIS — F900 Attention-deficit hyperactivity disorder, predominantly inattentive type: Secondary | ICD-10-CM | POA: Diagnosis not present

## 2018-09-12 DIAGNOSIS — K219 Gastro-esophageal reflux disease without esophagitis: Secondary | ICD-10-CM | POA: Diagnosis not present

## 2018-09-12 DIAGNOSIS — E039 Hypothyroidism, unspecified: Secondary | ICD-10-CM | POA: Diagnosis not present

## 2018-09-12 DIAGNOSIS — J309 Allergic rhinitis, unspecified: Secondary | ICD-10-CM | POA: Diagnosis not present

## 2018-09-12 DIAGNOSIS — I10 Essential (primary) hypertension: Secondary | ICD-10-CM | POA: Diagnosis not present

## 2018-09-12 DIAGNOSIS — M797 Fibromyalgia: Secondary | ICD-10-CM | POA: Diagnosis not present

## 2018-09-12 DIAGNOSIS — Z8349 Family history of other endocrine, nutritional and metabolic diseases: Secondary | ICD-10-CM | POA: Diagnosis not present

## 2018-09-12 DIAGNOSIS — F419 Anxiety disorder, unspecified: Secondary | ICD-10-CM | POA: Diagnosis not present

## 2018-09-12 DIAGNOSIS — E785 Hyperlipidemia, unspecified: Secondary | ICD-10-CM | POA: Diagnosis not present

## 2018-10-08 DIAGNOSIS — Z8349 Family history of other endocrine, nutritional and metabolic diseases: Secondary | ICD-10-CM | POA: Diagnosis not present

## 2018-10-18 MED FILL — ALPRAZOLAM 0.5 MG TABS: 0.5 | 30 days supply | Qty: 30 | Fill #0

## 2018-12-03 DIAGNOSIS — T781XXA Other adverse food reactions, not elsewhere classified, initial encounter: Secondary | ICD-10-CM | POA: Diagnosis not present

## 2018-12-03 DIAGNOSIS — J3081 Allergic rhinitis due to animal (cat) (dog) hair and dander: Secondary | ICD-10-CM | POA: Diagnosis not present

## 2018-12-03 DIAGNOSIS — T781XXD Other adverse food reactions, not elsewhere classified, subsequent encounter: Secondary | ICD-10-CM | POA: Diagnosis not present

## 2018-12-03 DIAGNOSIS — J3089 Other allergic rhinitis: Secondary | ICD-10-CM | POA: Diagnosis not present

## 2018-12-03 DIAGNOSIS — J301 Allergic rhinitis due to pollen: Secondary | ICD-10-CM | POA: Diagnosis not present

## 2018-12-03 MED FILL — LEVOTHYROXINE 50 MCG TABLET: 50 | 90 days supply | Qty: 96 | Fill #1

## 2018-12-03 MED FILL — LIOTHYRONINE SODIUM 5 MCG T: 5 | 90 days supply | Qty: 180 | Fill #1

## 2018-12-03 MED FILL — FLUoxetine HCL 40 MG CAPS: 40 | 90 days supply | Qty: 90 | Fill #1

## 2018-12-03 MED FILL — METHOCARBAMOL 500 MG TABS: 500 | 90 days supply | Qty: 360 | Fill #1

## 2018-12-03 MED FILL — ALPRAZolam 0.5 MG TABS: 0.5 | 30 days supply | Qty: 30 | Fill #0

## 2018-12-03 MED FILL — EPINEPHRINE 0.3 MG AUTO-INJ: 0.3 | 4 days supply | Qty: 4 | Fill #0

## 2018-12-03 MED FILL — KETOROLAC 10 MG TABLET: 10 | 90 days supply | Qty: 180 | Fill #1

## 2018-12-03 MED FILL — PANTOPRAZOLE SOD DR 40 MG T: 40 | 90 days supply | Qty: 90 | Fill #1

## 2018-12-05 MED FILL — traMADol HCL 50 MG TABS: 50 | 30 days supply | Qty: 120 | Fill #0

## 2019-01-10 ENCOUNTER — Other Ambulatory Visit: Payer: Self-pay | Admitting: Surgery

## 2019-01-10 DIAGNOSIS — I712 Thoracic aortic aneurysm, without rupture, unspecified: Secondary | ICD-10-CM

## 2019-02-13 ENCOUNTER — Other Ambulatory Visit: Payer: Self-pay

## 2019-02-13 ENCOUNTER — Ambulatory Visit: Payer: 59 | Admitting: Surgery

## 2019-02-13 ENCOUNTER — Encounter: Payer: Self-pay | Admitting: Surgery

## 2019-02-13 ENCOUNTER — Other Ambulatory Visit: Payer: Self-pay | Admitting: Cardiovascular Disease

## 2019-02-13 ENCOUNTER — Ambulatory Visit
Admission: RE | Admit: 2019-02-13 | Discharge: 2019-02-13 | Disposition: A | Discharge status: Home / Self Care | Payer: 59 | Source: Ambulatory Visit | Attending: Surgery | Admitting: Surgery

## 2019-02-13 VITALS — BP 202/108 | HR 69 | Temp 97.3°F | Resp 20 | Ht 64.5 in | Wt 168.7 lb

## 2019-02-13 DIAGNOSIS — I712 Thoracic aortic aneurysm, without rupture, unspecified: Secondary | ICD-10-CM

## 2019-02-13 DIAGNOSIS — I350 Nonrheumatic aortic (valve) stenosis: Secondary | ICD-10-CM

## 2019-02-13 MED ORDER — IOPAMIDOL (ISOVUE-370) INJECTION 76%
75.0000 mL | Freq: Once | INTRAVENOUS | Status: AC | PRN
  Administered 2019-02-13: 75 mL via INTRAVENOUS

## 2019-02-14 NOTE — Telephone Encounter (Signed)
Rx request sent to pharmacy.  

## 2019-02-14 NOTE — Progress Notes (Signed)
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  It measured 4.7 cm on CT scan in May 2019 but was measured at 4.5 cm in 01/2018.  Her last echocardiogram was on 09/26/2017 and showed normal left ventricular systolic function and internal left ventricular dimensions with moderate aortic insufficiency.  She has a trileaflet aortic valve with mildly thickened and mildly calcified leaflets with normal mobility.  The mean gradient at that time is 11 mmHg. She has been feeling well since I last saw her with no chest or back pain. She denies any shortness of breath.  Current Outpatient Medications  Medication Sig Dispense Refill   albuterol (PROVENTIL HFA;VENTOLIN HFA) 108 (90 Base) MCG/ACT inhaler Inhale 2 puffs into the lungs every 6 (six) hours as needed for wheezing or shortness of breath.     ALPRAZolam (XANAX) 0.5 MG tablet Take 0.5 mg by mouth at bedtime as needed for anxiety.     amphetamine-dextroamphetamine (ADDERALL) 15 MG tablet Take by mouth daily. Pt takes 0.5 tablet     esomeprazole (NEXIUM) 20 MG capsule Take 20 mg by mouth daily at 12 noon.     FLUoxetine (PROZAC) 40 MG capsule   4   ketorolac (TORADOL) 10 MG tablet as needed.  0   levothyroxine (SYNTHROID, LEVOTHROID) 50 MCG tablet Take 50 mcg by mouth daily before breakfast. Takes additional 1.5 on sundays     lidocaine (LIDODERM) 5 % Place 1 patch onto the skin daily. Remove & Discard patch within 12 hours or as directed by MD     liothyronine (CYTOMEL) 5 MCG tablet Take 5 mcg by mouth daily.     loratadine (CLARITIN) 10 MG tablet Take 10 mg by mouth daily.     methocarbamol (ROBAXIN) 500 MG tablet Take 500 mg by mouth 4 (four) times daily.     montelukast (SINGULAIR) 10 MG tablet Take 10 mg by mouth at bedtime.     Multiple Vitamin (MULTIVITAMIN) tablet Take 1 tablet by mouth daily.     traMADol (ULTRAM) 50 MG tablet Take 50 mg by  mouth every 6 (six) hours as needed.     irbesartan (AVAPRO) 300 MG tablet Take 1 tablet (300 mg total) by mouth daily. 90 tablet 3   No current facility-administered medications for this visit.     Physical Exam: BP (!) 202/108 (BP Location: Right Arm) Comment (BP Location): manual check   Pulse 69    Temp (!) 97.3 F (36.3 C) (Skin)    Resp 20    Ht 5' 4.5" (1.638 m)    Wt 168 lb 11.2 oz (76.5 kg)    SpO2 98% Comment: RA   BMI 28.51 kg/m  She looks well. Cardiac exam shows a regular rate and rhythm with a 2/6 diastolic murmur along left lower sternal border. Lung exam is clear. There is no lower extremity edema.  Diagnostic Tests:  CLINICAL DATA:  53 year old female with thoracic aortic aneurysm  EXAM: CT ANGIOGRAPHY CHEST WITH CONTRAST  TECHNIQUE: Multidetector CT imaging of the chest was performed using the standard protocol during bolus administration of intravenous contrast. Multiplanar CT image reconstructions and MIPs were obtained to evaluate the vascular anatomy.  CONTRAST:  44mL ISOVUE-370 IOPAMIDOL (ISOVUE-370) INJECTION 76%  COMPARISON:  01/08/2018, 07/26/2017  FINDINGS: Cardiovascular:  Heart:  No cardiomegaly. No pericardial fluid/thickening. No significant coronary calcifications.  Aorta:  Diameter of the ascending aorta estimated 4.5 cm. Common  origin of the innominate artery an the left common carotid artery. Minimal atherosclerotic changes at the origin of the left vertebral artery. No significant atherosclerotic changes of the descending thoracic aorta.  Pulmonary arteries:  No central, lobar, segmental, or proximal subsegmental filling defects.  Mediastinum/Nodes: No mediastinal adenopathy. Unremarkable appearance of the thoracic esophagus.  Hiatal hernia.  Unremarkable thoracic inlet.  Lungs/Pleura: Central airways are clear. No pleural effusion. No confluent airspace disease.  No pneumothorax.  Upper Abdomen:  No acute.  Musculoskeletal: No acute displaced fracture. Minimal degenerative changes  Review of the MIP images confirms the above findings.  IMPRESSION: No acute finding.  Ascending aorta unchanged measuring 4.5 cm.  Signed,  Yvone Neu. Reyne Dumas, RPVI  [facility]  [facility]   Electronically Signed   By: Gilmer Mor D.O.   On: 02/13/2019 10:31   Impression:  This 53 year old woman has a stable 4.5 cm fusiform ascending aortic aneurysm.  Her aortic root appears to be of normal size but she had moderate aortic insufficiency by echocardiogram dating back to 08/23/2011.  This was unchanged on her most recent echocardiogram of 09/26/2017.  I think she should have a yearly echocardiogram to follow-up on this to be sure that her left ventricular systolic function and internal dimensions are remaining stable.  Her aneurysm is still well below the 5.5 cm surgical threshold.  I do not think she would require aortic valve replacement unless she develops worsening aortic insufficiency, and progressive increase in her left ventricular internal dimensions, or symptoms.  Sometimes patients with aortic insufficiency can remain relatively asymptomatic despite having progressive deterioration of their left ventricular function.  I reviewed the CT images with her and answered all of her questions.  I discussed the symptoms of aortic insufficiency so that she would know what to look for.  Plan:  She is going to call Dr. Tresa Endo to set up another echo. I will see her back in one year with a CTA of the chest.  I spent 15 minutes performing this established patient evaluation and > 50% of this time was spent face to face counseling and coordinating the care of this patient's aortic aneurysm and aortic insufficiency.    Alleen Borne, MD [facility] [phone]

## 2019-02-15 MED FILL — IRBESARTAN 300 MG TABS: 300 | 30 days supply | Qty: 30 | Fill #0

## 2019-02-18 ENCOUNTER — Telehealth: Payer: Self-pay

## 2019-02-18 NOTE — Telephone Encounter (Signed)
[facility] MESSAGE 02/13/2019:   Hughes Better, MD 5 days ago  MM Hello,  I just saw Dr Cyndia Bent today and he asked that I have an echo.  I have not had one this year so I need to check my EF and Valves.  Thanks, Bethany Baker

## 2019-02-19 NOTE — Addendum Note (Signed)
Addended by: Annita Brod on: 02/19/2019 06:14 PM   Modules accepted: Orders

## 2019-02-24 NOTE — Telephone Encounter (Signed)
I believe we discussed this in the office after clinic to schedule patient for follow-up echo

## 2019-02-26 NOTE — Telephone Encounter (Signed)
Pt echo scheduled for 12/31

## 2019-02-28 ENCOUNTER — Other Ambulatory Visit: Payer: Self-pay

## 2019-02-28 ENCOUNTER — Ambulatory Visit (HOSPITAL_COMMUNITY): Payer: 59 | Attending: Cardiovascular Disease

## 2019-02-28 DIAGNOSIS — I350 Nonrheumatic aortic (valve) stenosis: Secondary | ICD-10-CM

## 2019-03-11 DIAGNOSIS — E785 Hyperlipidemia, unspecified: Secondary | ICD-10-CM | POA: Diagnosis not present

## 2019-03-11 DIAGNOSIS — E039 Hypothyroidism, unspecified: Secondary | ICD-10-CM | POA: Diagnosis not present

## 2019-03-12 ENCOUNTER — Encounter: Payer: Self-pay | Admitting: Cardiovascular Disease

## 2019-03-18 DIAGNOSIS — I10 Essential (primary) hypertension: Secondary | ICD-10-CM | POA: Diagnosis not present

## 2019-03-18 DIAGNOSIS — E039 Hypothyroidism, unspecified: Secondary | ICD-10-CM | POA: Diagnosis not present

## 2019-03-18 DIAGNOSIS — F419 Anxiety disorder, unspecified: Secondary | ICD-10-CM | POA: Diagnosis not present

## 2019-03-18 DIAGNOSIS — M25522 Pain in left elbow: Secondary | ICD-10-CM | POA: Diagnosis not present

## 2019-03-18 DIAGNOSIS — F988 Other specified behavioral and emotional disorders with onset usually occurring in childhood and adolescence: Secondary | ICD-10-CM | POA: Diagnosis not present

## 2019-03-18 DIAGNOSIS — I351 Nonrheumatic aortic (valve) insufficiency: Secondary | ICD-10-CM | POA: Diagnosis not present

## 2019-03-18 MED FILL — DICLOFENAC SODIUM 1 % GEL: 1 | 8 days supply | Qty: 100 | Fill #0

## 2019-03-25 DIAGNOSIS — M79643 Pain in unspecified hand: Secondary | ICD-10-CM | POA: Diagnosis not present

## 2019-03-25 DIAGNOSIS — M25522 Pain in left elbow: Secondary | ICD-10-CM | POA: Diagnosis not present

## 2019-03-25 DIAGNOSIS — I7300 Raynaud's syndrome without gangrene: Secondary | ICD-10-CM | POA: Diagnosis not present

## 2019-03-25 DIAGNOSIS — M7702 Medial epicondylitis, left elbow: Secondary | ICD-10-CM | POA: Diagnosis not present

## 2019-03-25 DIAGNOSIS — M79642 Pain in left hand: Secondary | ICD-10-CM | POA: Diagnosis not present

## 2019-03-25 DIAGNOSIS — M545 Low back pain: Secondary | ICD-10-CM | POA: Diagnosis not present

## 2019-03-25 DIAGNOSIS — M79641 Pain in right hand: Secondary | ICD-10-CM | POA: Diagnosis not present

## 2019-03-25 DIAGNOSIS — M1990 Unspecified osteoarthritis, unspecified site: Secondary | ICD-10-CM | POA: Diagnosis not present

## 2019-03-25 DIAGNOSIS — M2550 Pain in unspecified joint: Secondary | ICD-10-CM | POA: Diagnosis not present

## 2019-03-25 DIAGNOSIS — M79671 Pain in right foot: Secondary | ICD-10-CM | POA: Diagnosis not present

## 2019-03-25 DIAGNOSIS — D8989 Other specified disorders involving the immune mechanism, not elsewhere classified: Secondary | ICD-10-CM | POA: Diagnosis not present

## 2019-03-25 DIAGNOSIS — M79672 Pain in left foot: Secondary | ICD-10-CM | POA: Diagnosis not present

## 2019-03-25 MED FILL — predniSONE 5 MG TABS: 5 | 12 days supply | Qty: 42 | Fill #0

## 2019-04-23 DIAGNOSIS — M79643 Pain in unspecified hand: Secondary | ICD-10-CM | POA: Diagnosis not present

## 2019-04-23 DIAGNOSIS — M359 Systemic involvement of connective tissue, unspecified: Secondary | ICD-10-CM | POA: Diagnosis not present

## 2019-04-23 DIAGNOSIS — I7300 Raynaud's syndrome without gangrene: Secondary | ICD-10-CM | POA: Diagnosis not present

## 2019-04-23 DIAGNOSIS — M7702 Medial epicondylitis, left elbow: Secondary | ICD-10-CM | POA: Diagnosis not present

## 2019-04-23 DIAGNOSIS — M2550 Pain in unspecified joint: Secondary | ICD-10-CM | POA: Diagnosis not present

## 2019-04-23 DIAGNOSIS — M545 Low back pain: Secondary | ICD-10-CM | POA: Diagnosis not present

## 2019-04-23 DIAGNOSIS — M1990 Unspecified osteoarthritis, unspecified site: Secondary | ICD-10-CM | POA: Diagnosis not present

## 2019-04-23 DIAGNOSIS — M25522 Pain in left elbow: Secondary | ICD-10-CM | POA: Diagnosis not present

## 2019-04-23 MED FILL — HYDROXYCHLOROQUINE 200 MG T: 200 | 30 days supply | Qty: 60 | Fill #0

## 2019-05-21 ENCOUNTER — Other Ambulatory Visit (HOSPITAL_BASED_OUTPATIENT_CLINIC_OR_DEPARTMENT_OTHER): Payer: Self-pay | Admitting: Cardiovascular Disease

## 2019-05-21 ENCOUNTER — Ambulatory Visit: Payer: 59 | Admitting: Cardiovascular Disease

## 2019-05-21 ENCOUNTER — Other Ambulatory Visit: Payer: Self-pay

## 2019-05-21 DIAGNOSIS — E039 Hypothyroidism, unspecified: Secondary | ICD-10-CM

## 2019-05-21 DIAGNOSIS — Z8739 Personal history of other diseases of the musculoskeletal system and connective tissue: Secondary | ICD-10-CM

## 2019-05-21 DIAGNOSIS — I10 Essential (primary) hypertension: Secondary | ICD-10-CM

## 2019-05-21 DIAGNOSIS — G4733 Obstructive sleep apnea (adult) (pediatric): Secondary | ICD-10-CM | POA: Diagnosis not present

## 2019-05-21 DIAGNOSIS — I350 Nonrheumatic aortic (valve) stenosis: Secondary | ICD-10-CM

## 2019-05-21 DIAGNOSIS — Z87898 Personal history of other specified conditions: Secondary | ICD-10-CM

## 2019-05-21 DIAGNOSIS — I351 Nonrheumatic aortic (valve) insufficiency: Secondary | ICD-10-CM | POA: Diagnosis not present

## 2019-05-21 DIAGNOSIS — I77819 Aortic ectasia, unspecified site: Secondary | ICD-10-CM | POA: Diagnosis not present

## 2019-05-21 MED ORDER — METOPROLOL SUCCINATE ER 25 MG PO TB24: 25.0000 mg | ORAL_TABLET | Freq: Every day | ORAL | 3 refills | Status: DC

## 2019-05-21 MED FILL — METOPROLOL SUCCINATE ER 25: 25 | 90 days supply | Qty: 90 | Fill #0

## 2019-05-21 NOTE — Progress Notes (Signed)
Cardiology Office Note    Date:  05/24/2019   ID:  Bethany Baker, DOB 1965/03/14, MRN [id number]  PCP:  Bethany Gravel, MD  Cardiologist:  Bethany Majestic, MD   F/U cardiology evaluation  History of Present Illness:  Bethany Baker is a 54 y.o. female who was referred through the courtesy of Dr. Jani Baker for evaluation of aortic insufficiency. I last saw her in January 2019. She presents for a 2 year follow-up cardiology evaluation.  Ms. Bethany Baker reportedly has a history of mitral valve prolapse and aortic regurgitation, and in the past was felt to possibly have a bicuspid aortic valve.  In January 2001, she underwent a nuclear perfusion study after she developed complaints of chest pain radiating to her left arm with mild shortness of breath.  This revealed normal perfusion without scar or ischemia.  An ejection fraction of 64%.  She had remotely seen Dr. Tami Baker.  She underwent a carotid duplex study in March 2005 which was mildly abnormal.  At the origin of the left carotid and left subclavian.  The aorta measured 4 cm.  There was no significant diameter reduction in the carotid arteries.  She had undergone an echo Doppler study in June 2013 which showed an EF of 60-65% with grade 1 diastolic dysfunction.  There was moderate central aortic insufficiency with a posteriorly directed jet that impaired excursion of the anterior mitral valve leaflet.  Her mitral valve had a parachute appearance with late systolic prolapse in the anterior mitral leaflet reduced leaflet excursion.  The peak gradient was 10 mm.  She has not had recent follow-up of her valvular pathology.  She had recently seen Dr. Jani Baker.  He did appreciated a progressive aortic insufficiency murmur.  She had noticed some shortness of breath with activity and denies palpitations.  .  When I initially saw her, she told me that she was originally diagnosed of as having obstructive sleep apnea and had undergone an initial sleep study in  2005 which showed an AHI of 11 per hour.  She apparently underwent a CPAP titration in 2011 and was titrated up to 16 cm water pressure.  She had not used CPAP in over 5 years.  She currently admits to nonrestorative sleep, fatigue, and was waking upt 2-3 times per night with nocturia.   She was unaware of any nocturnal palpitations.    She underwent an echo Doppler study in 10/14/2016 which showed normal systolic function with an EF of 55-60%.  There was grade 1 diastolic dysfunction.  There was a thickened aortic valve with mild-to-moderate aortic insufficiency, mild prolapse of anterior mitral valve leaflet with trace MR, and her ascending aorta was found to be dilated at 4.6 cm.  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  Carotid duplex imaging showed soft plaque in the right bifurcation with 1-39% stenosis.  There was no dilation at the origin of the left carotid artery and subclavian artery measuring 3.0 x 3.0 cm.  She had normal subclavian arteries bilaterally, and patent vertebral arteries with antegrade flow.  She  underwent a F/U sleep study on 11/29/2016.  Overall AHI was 2.9, but her RDI was 11.5, which was similar to her remote AHI of years ago.  During REM sleep her AHI was 12.6/h.  Although she felt that she did not sleep well that night, she had 82.7% sleep efficiency.  Wake after sleep onset was only 58.1  minutes.  REM latency was 75 minutes.   In light of her ascending aneurysm.  I recommended institution of irbesartan 150 mg daily, which be hopeful both for blood pressure control as well as her diastolic dysfunction.  I referred her to Dr. Gilford Baker in light of her 4.7 mm fusiform ascending aortic aneurysm and follow-up echo Doppler findings.  At present, there is no indication for surgery at this time, but she will require close follow-up.  Her CTA of her chest.  We repeated in 6 months to establish stability and  then to decide on longer-term surveillance.  Surgery would not be recommended until the aneurysm was 5.5 cm, or there was a period of rapid enlargement of 5 mm over a year.  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  Typically she works the night shift, usually from 8 PM to 8 AM but at times has to work additional shifts commencing at 6 PM.  The days that she is not working she is switching her sleep cycle and as result, has not established a good circadian rhythm. .   Since I last saw her, over the last 2 years she has been followed by rheumatologist for mixed connective tissue disease.  She continues to work on Friday Saturday and Sunday evening shift from 8 PM to 8 AM.  Most of the time she is not working she typically is sleeping and essentially sleeps all of Monday.  She admits to loud snoring.  She has recently noticed some shortness of breath if she carries heavy boxes.  In addition she has noticed recent blood pressure elevation with blood pressures at home typically running in the 1 45-1 55 range.  She presents for a 2-year evaluation.    Past Medical History:  Diagnosis Date  . Aortic insufficiency   . Depression   . Fibromyalgia   . MR (mitral regurgitation)   . MVP (mitral valve prolapse)   . Systolic murmur   . Thyroid disease    HYPOTHYROIDISM    Past Surgical History:  Procedure Laterality Date  . TRANSESOPHAGEAL ECHOCARDIOGRAM      Current Medications: Outpatient Medications Prior to Visit  Medication Sig Dispense Refill  . albuterol (PROVENTIL HFA;VENTOLIN HFA) 108 (90 Base) MCG/ACT inhaler Inhale 2 puffs into the lungs every 6 (six) hours as needed for wheezing or shortness of breath.    . ALPRAZolam (XANAX) 0.5 MG tablet Take 0.5 mg by mouth at bedtime as needed for anxiety.    Marland Kitchen amphetamine-dextroamphetamine (ADDERALL) 15 MG tablet Take by mouth daily. Pt takes 0.5 tablet    . esomeprazole (NEXIUM) 20 MG  capsule Take 20 mg by mouth daily at 12 noon.    Marland Kitchen FLUoxetine (PROZAC) 40 MG capsule   4  . irbesartan (AVAPRO) 300 MG tablet Take 1 tablet (300 mg total) by mouth daily. Please schedule appointment for refills. 30 tablet 0  . ketorolac (TORADOL) 10 MG tablet as needed.  0  . levothyroxine (SYNTHROID, LEVOTHROID) 50 MCG tablet Take 50 mcg by mouth daily before breakfast. Takes additional 1.5 on sundays    . lidocaine (LIDODERM) 5 % Place 1 patch onto the skin daily. Remove & Discard patch within 12 hours or as directed by MD    . liothyronine (CYTOMEL) 5 MCG tablet Take 5 mcg by mouth daily.    Marland Kitchen loratadine (CLARITIN) 10 MG tablet Take 10 mg by mouth daily.    Marland Kitchen  methocarbamol (ROBAXIN) 500 MG tablet Take 500 mg by mouth 4 (four) times daily.    . montelukast (SINGULAIR) 10 MG tablet Take 10 mg by mouth at bedtime.    . Multiple Vitamin (MULTIVITAMIN) tablet Take 1 tablet by mouth daily.    . traMADol (ULTRAM) 50 MG tablet Take 50 mg by mouth every 6 (six) hours as needed.     No facility-administered medications prior to visit.     Allergies:   Acetaminophen, Adhesive [tape], Aspirin, Effexor [venlafaxine], Eggs or egg-derived products, Ibuprofen, Mercury, Wellbutrin [bupropion], and Atenolol   Social History   Socioeconomic History  . Marital status: Single    Spouse name: Not on file  . Number of children: Not on file  . Years of education: Not on file  . Highest education level: Not on file  Occupational History  . Not on file  Tobacco Use  . Smoking status: Never Smoker  . Smokeless tobacco: Never Used  Substance and Sexual Activity  . Alcohol use: No  . Drug use: No  . Sexual activity: Not on file  Other Topics Concern  . Not on file  Social History Narrative  . Not on file   Social Determinants of Health   Financial Resource Strain:   . Difficulty of Paying Living Expenses:   Food Insecurity:   . Worried About Charity fundraiser in the Last Year:   . Academic librarian in the Last Year:   Transportation Needs:   . Film/video editor (Medical):   Marland Kitchen Lack of Transportation (Non-Medical):   Physical Activity:   . Days of Exercise per Week:   . Minutes of Exercise per Session:   Stress:   . Feeling of Stress :   Social Connections:   . Frequency of Communication with Friends and Family:   . Frequency of Social Gatherings with Friends and Family:   . Attends Religious Services:   . Active Member of Clubs or Organizations:   . Attends Archivist Meetings:   Marland Kitchen Marital Status:      Family History:  The patient's family history includes Cancer in her mother; Fibromyalgia in her mother; Heart failure in her father; Hypertension in her brother and father; Mitral valve prolapse in her mother.   ROS General: Negative; No fevers, chills, or night sweats;  HEENT: Negative; No changes in vision or hearing, sinus congestion, difficulty swallowing Pulmonary: Negative; No cough, wheezing, shortness of breath, hemoptysis Cardiovascular: see HPI GI: Negative; No nausea, vomiting, diarrhea, or abdominal pain GU: Negative; No dysuria, hematuria, or difficulty voiding Musculoskeletal: Occasional leg cramps c Hematologic/Oncology: Negative; no easy bruising, bleeding Endocrine: Negative; no heat/cold intolerance; no diabetes Neuro: Negative; no changes in balance, headaches Skin: Negative; No rashes or skin lesions Psychiatric: Negative; No behavioral problems, depression Sleep: Positive for  sleep apnea, fatigue, nonrestorative sleep, nocturia and snoring; no bruxism, restless legs, hypnogognic hallucinations, no cataplexy Other comprehensive 14 point system review is negative.   Epworth Sleepiness Scale score calculated at her last office visit in October 2018 and endorsed at 6.   Epworth Sleepiness Scale: 05/21/2019 Situation   Chance of Dozing/Sleeping (0 = never , 1 = slight chance , 2 = moderate chance , 3 = high chance )   sitting and  reading 2   watching TV 3   sitting inactive in a public place 1   being a passenger in a motor vehicle for an hour or more 2   lying  down in the afternoon 2   sitting and talking to someone 0   sitting quietly after lunch (no alcohol) 1   while stopped for a few minutes in traffic as the driver 0   Total Score  11     PHYSICAL EXAM:   VS:  BP 138/86 (BP Location: Left Arm, Patient Position: Sitting, Cuff Size: Normal)   Pulse 87   Temp (!) 96.8 F (36 C)   Ht 5' 4.5" (1.638 m)   Wt 170 lb (77.1 kg)   BMI 28.73 kg/m     Blood pressure by me was elevated at 174/90  Wt Readings from Last 3 Encounters:  05/21/19 170 lb (77.1 kg)  02/13/19 168 lb 11.2 oz (76.5 kg)  02/07/18 168 lb (76.2 kg)   General: Alert, oriented, no distress.  Skin: normal turgor, no rashes, warm and dry HEENT: Normocephalic, atraumatic. Pupils equal round and reactive to light; sclera anicteric; extraocular muscles intact;  Nose without nasal septal hypertrophy Mouth/Parynx benign; Mallinpatti scale 3 Neck: No JVD, no carotid bruits; normal carotid upstroke Lungs: clear to ausculatation and percussion; no wheezing or rales Chest wall: without tenderness to palpitation Heart: PMI not displaced, RRR, s1 s2 normal, 6-0/1 systolic murmur, no diastolic murmur, no rubs, gallops, thrills, or heaves Abdomen: soft, nontender; no hepatosplenomehaly, BS+; abdominal aorta nontender and not dilated by palpation. Back: no CVA tenderness Pulses 2+ Musculoskeletal: full range of motion, normal strength, no joint deformities Extremities: no clubbing cyanosis or edema, Homan's sign negative  Neurologic: grossly nonfocal; Cranial nerves grossly wnl Psychologic: Normal mood and affect   Studies/Labs Reviewed:   EKG:  EKG is ordered today. ECG (independently read by me): Normal sinus rhythm at 87 bpm, probable left atrial enlargement, borderline LVH by voltage.  Nonspecific T wave abnormality.  QTc interval 454  ms  January 2019 ECG (independently read by me): Normal sinus rhythm 84 bpm.  LVH by voltage criteria in aVL.  Nonspecific T changes.  October 2018 ECG (independently read by me): Normal sinus rhythm at 85 bpm.  Left axis deviation.  LVH by voltage criteria.  August 2018 ECG (independently read by me): Normal sinus rhythm at 85 bpm.  Borderline voltage for LVH.  T-wave abnormality in lead 3 and aVF.  Recent Labs: BMP Latest Ref Rng & Units 10/20/2008 09/07/2007 03/24/2007  Glucose 70 - 99 mg/dL 86 81 125(H)  BUN 6 - 23 mg/dL '9 10 15  ' Creatinine 0.4 - 1.2 mg/dL 0.70 0.84 0.9  Sodium 135 - 145 mEq/L 138 139 140  Potassium 3.5 - 5.1 mEq/L 3.5 3.7 3.7  Chloride 96 - 112 mEq/L 103 103 106  CO2 19 - 32 mEq/L 29 23 -  Calcium 8.4 - 10.5 mg/dL 8.7 8.9 -     Hepatic Function Latest Ref Rng & Units 10/20/2008 09/07/2007  Total Protein 6.0 - 8.3 g/dL 6.8 6.9  Albumin 3.5 - 5.2 g/dL 3.9 4.5  AST 0 - 37 U/L 31 17  ALT 0 - 35 U/L 25 12  Alk Phosphatase 39 - 117 U/L 74 61  Total Bilirubin 0.3 - 1.2 mg/dL 0.6 0.5  Bilirubin, Direct 0.0 - 0.3 mg/dL - 0.1    CBC Latest Ref Rng & Units 10/20/2008 12/17/2007 03/24/2007  WBC 4.0 - 10.5 K/uL 5.7 5.3 -  Hemoglobin 12.0 - 15.0 g/dL 12.0 12.4 14.3  Hematocrit 36.0 - 46.0 % 36.1 35.9(L) 42.0  Platelets 150 - 400 K/uL 227 220 -   Lab Results  Component  Value Date   MCV 89.8 10/20/2008   MCV 99.6 12/17/2007   Lab Results  Component Value Date   TSH 2.956 09/07/2007   No results found for: HGBA1C   BNP No results found for: BNP  ProBNP No results found for: PROBNP   Lipid Panel     Component Value Date/Time   CHOL 186 09/07/2007 2130   TRIG 80 09/07/2007 2130   HDL 72 09/07/2007 2130   CHOLHDL 2.6 Ratio 09/07/2007 2130   VLDL 16 09/07/2007 2130   LDLCALC 98 09/07/2007 2130     RADIOLOGY: No results found.   Additional studies/ records that were reviewed today include:  I reviewed the  Remote nuclear perfusion study, carotid  duplex study, and the patient's last 2-D echo Doppler study from 2013.  I also reviewed her prior sleep evaluations and last CPAP titration trial of 2011.  I reviewed the records from Dr. Cyndia Bent , as well as her CT angiogram,  echo Doppler data. A new Epworth Sleepiness Scale score was calculated in the office today   ECHO 02/28/2020 IMPRESSIONS  1. Left ventricular ejection fraction, by visual estimation, is 60 to  65%. The left ventricle has normal function. There is no left ventricular  hypertrophy.  2. Left ventricular diastolic parameters are consistent with Grade I  diastolic dysfunction (impaired relaxation).  3. The left ventricle has no regional wall motion abnormalities.  4. Global right ventricle has normal systolic function.The right  ventricular size is normal. No increase in right ventricular wall  thickness.  5. Left atrial size was normal.  6. Right atrial size was normal.  7. Trivial pericardial effusion is present.  8. The mitral valve is normal in structure. Trivial mitral valve  regurgitation. No evidence of mitral stenosis.  9. The tricuspid valve is normal in structure.  10. Aortic valve regurgitation is moderate.  11. The aortic valve is normal in structure. Aortic valve regurgitation is  moderate. Mild aortic valve stenosis.  12. The pulmonic valve was normal in structure. Pulmonic valve  regurgitation is trivial.  13. Aortic dilatation noted.  14. 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  15. Normal pulmonary artery systolic pressure.  16. The inferior vena cava is normal in size with greater than 50%  respiratory variability, suggesting right atrial pressure of 3 mmHg.   ASSESSMENT:    1. Aortic valve stenosis, etiology of cardiac valve disease unspecified   2. Aortic valve insufficiency, etiology of cardiac valve disease unspecified   3. Acquired dilation of ascending aorta and aortic root ([place])   4. OSA (obstructive sleep  apnea)   5. Essential hypertension   6. H/O mixed connective tissue disease   7. Hypothyroidism, unspecified type     PLAN:   Rozell Kettlewell is a very pleasant 54 year-old female who has a history of mitral valve prolapse and previously noted at least moderate aortic insufficiency.  She also has a history of sleep apnea and had been on CPAP therapy remotely and has not used in many years.  On a prior carotid duplex study in 2005, she was noted to have some borderline aortic dilation.  When I initially saw her, she had a murmur of at least moderate aortic insufficiency as well as MR from mitral valve prolapse.  I reviewed her 5 year follow-up echo Doppler study with her in detail.  This confirmed aortic valve thickening with a mean gradient of 8, peak gradient of 14, and at least mild-to-moderate  aortic insufficiency.  There was mild prolapse of anterior mitral valve leaflet with no stenosis and only trivial MR.  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  Subsequent CT angiography suggested this to be 4.7 cm involving the ascending thoracic aorta.  She underwent evaluation by Dr. Cyndia Bent And continues to see him for follow-up evaluations with his last visit in December 2020.  It is recommended that she have yearly echocardiograms for follow-up of her ascending aortic aneurysm as well as her moderate aortic insufficiency.  Reviewed her most recent echo from February 28, 2019 with her in detail which continues to show normal systolic function with grade 1 diastolic dysfunction, moderate aortic regurgitation with mild aortic stenosis and dilation of her ascending aorta measuring 46 mm.  At present, there is no surgical indication for repair and she will undergo follow-up CT angiography study to assess stability and to determine frequency of future evaluations.  Her blood pressure today is significantly elevated despite taking irbesartan 300 mg daily.  Presently I am adding metoprolol succinate 25 mg to her  medical regimen for more optimal blood pressure control as well as heart rate control.  Target blood pressure is less than 130/80.  She has had progressive difficulty with sleep since her previous evaluation.  Also has disturbed circadian rhythm due to her evening work on Friday Saturday and Sunday from 8 PM to 8 AM and then shifting her circadian rhythm during the week but she find that she is sleeping most of the time.  I am scheduling her for follow-up sleep study for further evaluation.  She continues to be on levothyroxine for hypothyroidism currently at 50 mcg.  She is on Nexium for GERD.  I will see her in 2 to 3 months for follow-up evaluation or sooner if problems arise.    Medication Adjustments/Labs and Tests Ordered: Current medicines are reviewed at length with the patient today.  Concerns regarding medicines are outlined above.  Medication changes, Labs and Tests ordered today are listed in the Patient Instructions below. Patient Instructions  Medication Instructions:  BEGIN TAKING METOPROLOL SUCCINATE 25 MG DAILY   *If you need a refill on your cardiac medications before your next appointment, please call your pharmacy*   Testing/Procedures: Your physician has recommended that you have a sleep study. This test records several body functions during sleep, including: brain activity, eye movement, oxygen and carbon dioxide blood levels, heart rate and rhythm, breathing rate and rhythm, the flow of air through your mouth and nose, snoring, body muscle movements, and chest and belly movement.  OUR SLEEP COORDINATOR WILL CONTACT YOU TO SCHEDULE THIS   Follow-Up: At [facility], you and your health needs are our priority.  As part of our continuing mission to provide you with exceptional heart care, we have created designated Provider Care Teams.  These Care Teams include your primary Cardiologist (physician) and Advanced Practice Providers (APPs -  Physician Assistants and Nurse  Practitioners) who all work together to provide you with the care you need, when you need it.  We recommend signing up for the patient portal called "MyChart".  Sign up information is provided on this After Visit Summary.  MyChart is used to connect with patients for Virtual Visits (Telemedicine).  Patients are able to view lab/test results, encounter notes, upcoming appointments, etc.  Non-urgent messages can be sent to your provider as well.   To learn more about what you can do with MyChart, go to [email or web address].  Your next appointment:   2-3 month(s)  The format for your next appointment:   In Person  Provider:   Shelva Majestic, MD        Signed, Bethany Majestic, MD  05/24/2019 2:37 PM    [facility] [address] [place], [place], [place]  [address] Phone: [phone]

## 2019-05-21 NOTE — Patient Instructions (Signed)
Medication Instructions:  BEGIN TAKING METOPROLOL SUCCINATE 25 MG DAILY   *If you need a refill on your cardiac medications before your next appointment, please call your pharmacy*   Testing/Procedures: Your physician has recommended that you have a sleep study. This test records several body functions during sleep, including: brain activity, eye movement, oxygen and carbon dioxide blood levels, heart rate and rhythm, breathing rate and rhythm, the flow of air through your mouth and nose, snoring, body muscle movements, and chest and belly movement.  OUR SLEEP COORDINATOR WILL CONTACT YOU TO SCHEDULE THIS   Follow-Up: At [facility], you and your health needs are our priority.  As part of our continuing mission to provide you with exceptional heart care, we have created designated Provider Care Teams.  These Care Teams include your primary Cardiologist (physician) and Advanced Practice Providers (APPs -  Physician Assistants and Nurse Practitioners) who all work together to provide you with the care you need, when you need it.  We recommend signing up for the patient portal called "MyChart".  Sign up information is provided on this After Visit Summary.  MyChart is used to connect with patients for Virtual Visits (Telemedicine).  Patients are able to view lab/test results, encounter notes, upcoming appointments, etc.  Non-urgent messages can be sent to your provider as well.   To learn more about what you can do with MyChart, go to [email or web address].    Your next appointment:   2-3 month(s)  The format for your next appointment:   In Person  Provider:   Nicki Guadalajara, MD

## 2019-05-22 ENCOUNTER — Telehealth: Payer: Self-pay | Admitting: *Deleted

## 2019-05-22 NOTE — Telephone Encounter (Signed)
Left voice message to return a call to discuss sleep study appointment.

## 2019-05-23 MED FILL — HYDROXYCHLOROQUINE 200 MG T: 200 | 30 days supply | Qty: 60 | Fill #1

## 2019-05-24 ENCOUNTER — Encounter: Payer: Self-pay | Admitting: Cardiovascular Disease

## 2019-05-27 ENCOUNTER — Other Ambulatory Visit: Payer: Self-pay | Admitting: Cardiovascular Disease

## 2019-05-28 MED FILL — IRBESARTAN 300 MG TABS: 300 | 30 days supply | Qty: 30 | Fill #0

## 2019-06-08 ENCOUNTER — Other Ambulatory Visit (HOSPITAL_COMMUNITY)
Admission: RE | Admit: 2019-06-08 | Discharge: 2019-06-08 | Disposition: A | Discharge status: Home / Self Care | Payer: 59 | Source: Ambulatory Visit | Attending: Cardiovascular Disease | Admitting: Cardiovascular Disease

## 2019-06-08 DIAGNOSIS — Z01812 Encounter for preprocedural laboratory examination: Secondary | ICD-10-CM | POA: Insufficient documentation

## 2019-06-08 DIAGNOSIS — Z20822 Contact with and (suspected) exposure to covid-19: Secondary | ICD-10-CM | POA: Insufficient documentation

## 2019-06-08 LAB — SARS CORONAVIRUS 2 (TAT 6-24 HRS): SARS Coronavirus 2: NEGATIVE

## 2019-06-10 ENCOUNTER — Encounter (HOSPITAL_BASED_OUTPATIENT_CLINIC_OR_DEPARTMENT_OTHER): Payer: Self-pay | Admitting: Cardiovascular Disease

## 2019-06-10 ENCOUNTER — Ambulatory Visit (HOSPITAL_BASED_OUTPATIENT_CLINIC_OR_DEPARTMENT_OTHER): Payer: 59 | Attending: Cardiovascular Disease | Admitting: Cardiovascular Disease

## 2019-06-10 ENCOUNTER — Other Ambulatory Visit: Payer: Self-pay

## 2019-06-10 DIAGNOSIS — G4733 Obstructive sleep apnea (adult) (pediatric): Secondary | ICD-10-CM | POA: Insufficient documentation

## 2019-06-10 DIAGNOSIS — I350 Nonrheumatic aortic (valve) stenosis: Secondary | ICD-10-CM | POA: Diagnosis not present

## 2019-06-10 HISTORY — DX: Obstructive sleep apnea (adult) (pediatric): G47.33

## 2019-06-23 ENCOUNTER — Encounter (HOSPITAL_BASED_OUTPATIENT_CLINIC_OR_DEPARTMENT_OTHER): Payer: Self-pay | Admitting: Cardiovascular Disease

## 2019-06-23 NOTE — Procedures (Signed)
Patient Name: Bethany Baker, Bethany Baker Date: 06/10/2019 Gender: Female D.O.B: 05-Sep-1965 Age (years): 89 Referring Provider: Shelva Majestic MD, [facility] Height (inches): 65 Interpreting Physician: Shelva Majestic MD, ABSM Weight (lbs): 170 RPSGT: Carolin Coy BMI: 29 MRN: [id number] Neck Size: 14.50  CLINICAL INFORMATION The patient is referred for a split night study with BPAP. Most recent polysomnogram dated 11/29/2016 revealed an AHI of 2.9/h and RDI of 11.5/h.  MEDICATIONS albuterol (PROVENTIL HFA;VENTOLIN HFA) 108 (90 Base) MCG/ACT inhaler  ALPRAZolam (XANAX) 0.5 MG tablet  amphetamine-dextroamphetamine (ADDERALL) 15 MG tablet  esomeprazole (NEXIUM) 20 MG capsule  FLUoxetine (PROZAC) 40 MG capsule  irbesartan (AVAPRO) 300 MG tablet  ketorolac (TORADOL) 10 MG tablet  levothyroxine (SYNTHROID, LEVOTHROID) 50 MCG tablet  lidocaine (LIDODERM) 5 %  liothyronine (CYTOMEL) 5 MCG tablet  loratadine (CLARITIN) 10 MG tablet  methocarbamol (ROBAXIN) 500 MG tablet  metoprolol succinate (TOPROL-XL) 25 MG 24 hr tablet  montelukast (SINGULAIR) 10 MG tablet  Multiple Vitamin (MULTIVITAMIN) tablet  traMADol (ULTRAM) 50 MG tablet   Medications self-administered by patient taken the night of the study : [place] As per the AASM Manual for the Scoring of Sleep and Associated Events v2.3 (April 2016) with a hypopnea requiring 4% desaturations.  The channels recorded and monitored were frontal, central and occipital EEG, electrooculogram (EOG), submentalis EMG (chin), nasal and oral airflow, thoracic and abdominal wall motion, anterior tibialis EMG, snore microphone, electrocardiogram, and pulse oximetry. Bi-level positive airway pressure (BiPAP) was initiated when the patient met split night criteria and was titrated according to treat sleep-disordered breathing.  RESPIRATORY PARAMETERS Diagnostic Total AHI (/hr): 14.4 RDI (/hr): 21.4 OA Index (/hr): - CA Index  (/hr): 0.0 REM AHI (/hr): 29.3 NREM AHI (/hr): 12.1 Supine AHI (/hr): N/A Non-supine AHI (/hr): 22.14 Min O2 Sat (%): 87.0 Mean O2 (%): 91.2 Time below 88% (min): 1.9   Titration Optimal IPAP Pressure (cm): 20 Optimal EPAP Pressure (cm): 16 AHI at Optimal Pressure (/hr): 6.4 Min O2 at Optimal Pressure (%): 92.0 Sleep % at Optimal (%): 99 Supine % at Optimal (%): 0   SLEEP ARCHITECTURE The study was initiated at 10:37:42 PM and terminated at 5:37:47 AM. The total recorded time was 420.1 minutes. EEG confirmed total sleep time was 353 minutes yielding a sleep efficiency of 84.0%%. Sleep onset after lights out was 32.5 minutes with a REM latency of 103.0 minutes. The patient spent 5.4%% of the night in stage N1 sleep, 80.2%% in stage N2 sleep, 0.1%% in stage N3 and 14.3% in REM. Wake after sleep onset (WASO) was 34.6 minutes. The Arousal Index was 17.8/hour.  LEG MOVEMENT DATA The total Periodic Limb Movements of Sleep (PLMS) were 0. The PLMS index was 0.0 .  CARDIAC DATA The 2 lead EKG demonstrated sinus rhythm. The mean heart rate was 100.0 beats per minute. Other EKG findings include: None.  IMPRESSIONS - Mild-moderate obstructive sleep apnea overall during the diagnostic portion of the study (AHI 14.4 /h; RDI 21.4/h); however, events were moderate-severe during REM sleep (AHI 29.3/h).  CPAP was initiated at 5 cm and wad titrated to 18 cm of water (AHI 15.6/h), due to continued events BiPAP was initiated at 20/16 (AHI 6.4/h). - No significant central sleep apnea occurred during the diagnostic portion of the study (CAI = 0.0/hour). - The patient had mild oxygen desaturation during the diagnostic portion of the study to a nadir of 87%. - The patient snored with soft snoring volume during the diagnostic portion of the  study. - Cardiac abnormalities were noted during this study; PACs - Clinically significant periodic limb movements of sleep did not occur during the study.  DIAGNOSIS -  Obstructive Sleep Apnea (327.23 [G47.33 ICD-10])  RECOMMENDATIONS - Recommend an initial trial of BiPAP Auto therapy with EPAP min of 15, PS of 4 and IPAP max of 25 cm of water with heated humidification.  A Medium size Resmed Nasal Pillow Mask AirFit P10 mask was used for the titration. - Effort should be made to optimize nasal and oropharyngeal patency. - Avoid alcohol, sedatives and other CNS depressants that may worsen sleep apnea and disrupt normal sleep architecture. - Sleep hygiene should be reviewed to assess factors that may improve sleep quality. - Weight management and regular exercise should be initiated or continued. - Recommend a download in 30 days and sleep clinic evaluation after one month of therapy.  [Electronically signed] 06/23/2019 06:07 PM  Shelva Majestic MD, [facility], [place], American Board of Sleep Medicine   NPI: [id number] [place] PH: [phone]   FX: [phone] [place]

## 2019-06-28 ENCOUNTER — Telehealth: Payer: Self-pay | Admitting: *Deleted

## 2019-06-28 NOTE — Telephone Encounter (Signed)
Informed patient of sleep study results and patient understanding was verbalized. Patient understands her sleep study showed   DIAGNOSIS - Obstructive Sleep Apnea (327.23 [G47.33 ICD-10])  Recommend an initial trial of BiPAP Auto therapy with EPAP min of 15, PS of 4 and IPAP max of 25 cm of water with heated humidification.  A Medium size Resmed Nasal Pillow Mask AirFit P10 mask was used for the titration.  Upon patient request DME selection is [facility]. Patient understands she will be contacted by [facility] to set up her cpap. Patient understands to call if [facility] does not contact her with new setup in a timely manner. Patient understands they will be called once confirmation has been received from [facility] that they have received their new machine to schedule 10 week follow up appointment.  [facility] notified of new cpap order  Please add to airview Patient was grateful for the call and thanked me.

## 2019-06-28 NOTE — Telephone Encounter (Signed)
-----   Message from Lennette Bihari, MD sent at 06/23/2019  6:14 PM EDT ----- Bethany Baker, please notify pt and set up with DME for BiPAP initiation

## 2019-07-12 DIAGNOSIS — H903 Sensorineural hearing loss, bilateral: Secondary | ICD-10-CM | POA: Diagnosis not present

## 2019-07-16 DIAGNOSIS — G4733 Obstructive sleep apnea (adult) (pediatric): Secondary | ICD-10-CM | POA: Diagnosis not present

## 2019-07-31 ENCOUNTER — Ambulatory Visit: Payer: 59 | Admitting: Cardiovascular Disease

## 2019-08-16 DIAGNOSIS — G4733 Obstructive sleep apnea (adult) (pediatric): Secondary | ICD-10-CM | POA: Diagnosis not present

## 2019-08-24 ENCOUNTER — Other Ambulatory Visit: Payer: Self-pay

## 2019-08-26 MED ORDER — IRBESARTAN 300 MG PO TABS
300.0000 mg | ORAL_TABLET | Freq: Every day | ORAL | 3 refills | Status: DC
  Filled 2020-07-01: qty 90, 90d supply, fill #0

## 2019-08-26 MED FILL — IRBESARTAN 300 MG TAB: 300 | 90 days supply | Qty: 90 | Fill #0

## 2019-09-06 MED FILL — METHOCARBAMOL 500 MG TABS: 500 | 90 days supply | Qty: 360 | Fill #0

## 2019-09-12 DIAGNOSIS — I10 Essential (primary) hypertension: Secondary | ICD-10-CM | POA: Diagnosis not present

## 2019-09-12 DIAGNOSIS — E039 Hypothyroidism, unspecified: Secondary | ICD-10-CM | POA: Diagnosis not present

## 2019-09-15 DIAGNOSIS — G4733 Obstructive sleep apnea (adult) (pediatric): Secondary | ICD-10-CM | POA: Diagnosis not present

## 2019-10-16 DIAGNOSIS — G4733 Obstructive sleep apnea (adult) (pediatric): Secondary | ICD-10-CM | POA: Diagnosis not present

## 2019-10-28 ENCOUNTER — Ambulatory Visit: Payer: 59 | Admitting: Cardiovascular Disease

## 2019-10-28 ENCOUNTER — Other Ambulatory Visit: Payer: Self-pay

## 2019-10-28 ENCOUNTER — Encounter: Payer: Self-pay | Admitting: Cardiovascular Disease

## 2019-10-28 DIAGNOSIS — I350 Nonrheumatic aortic (valve) stenosis: Secondary | ICD-10-CM | POA: Diagnosis not present

## 2019-10-28 DIAGNOSIS — I351 Nonrheumatic aortic (valve) insufficiency: Secondary | ICD-10-CM

## 2019-10-28 DIAGNOSIS — Z87898 Personal history of other specified conditions: Secondary | ICD-10-CM | POA: Diagnosis not present

## 2019-10-28 DIAGNOSIS — E039 Hypothyroidism, unspecified: Secondary | ICD-10-CM

## 2019-10-28 DIAGNOSIS — I10 Essential (primary) hypertension: Secondary | ICD-10-CM

## 2019-10-28 DIAGNOSIS — I341 Nonrheumatic mitral (valve) prolapse: Secondary | ICD-10-CM

## 2019-10-28 DIAGNOSIS — Z8739 Personal history of other diseases of the musculoskeletal system and connective tissue: Secondary | ICD-10-CM

## 2019-10-28 DIAGNOSIS — G4733 Obstructive sleep apnea (adult) (pediatric): Secondary | ICD-10-CM | POA: Diagnosis not present

## 2019-10-28 DIAGNOSIS — I77819 Aortic ectasia, unspecified site: Secondary | ICD-10-CM

## 2019-10-28 MED ORDER — METOPROLOL SUCCINATE ER 50 MG PO TB24
50.0000 mg | ORAL_TABLET | Freq: Every day | ORAL | 3 refills | Status: DC
  Filled 2020-07-01: qty 90, 90d supply, fill #0

## 2019-10-28 MED FILL — METOPROLOL SUCCINATE ER 50: 50 | 90 days supply | Qty: 90 | Fill #0

## 2019-10-28 NOTE — Patient Instructions (Signed)
Medication Instructions:  INCREASE YOUR METOPROLOL SUCCINATE TO 50MG  DAILY  *If you need a refill on your cardiac medications before your next appointment, please call your pharmacy*    Testing/Procedures: IN DECEMBER Your physician has requested that you have an echocardiogram. Echocardiography is a painless test that uses sound waves to create images of your heart. It provides your doctor with information about the size and shape of your heart and how well your heart's chambers and valves are working. This procedure takes approximately one hour. There are no restrictions for this procedure.     Follow-Up: At [facility], you and your health needs are our priority.  As part of our continuing mission to provide you with exceptional heart care, we have created designated Provider Care Teams.  These Care Teams include your primary Cardiologist (physician) and Advanced Practice Providers (APPs -  Physician Assistants and Nurse Practitioners) who all work together to provide you with the care you need, when you need it.  We recommend signing up for the patient portal called "MyChart".  Sign up information is provided on this After Visit Summary.  MyChart is used to connect with patients for Virtual Visits (Telemedicine).  Patients are able to view lab/test results, encounter notes, upcoming appointments, etc.  Non-urgent messages can be sent to your provider as well.   To learn more about what you can do with MyChart, go to [facility].    Your next appointment:  IN JANUARY 5 month(s)   The format for your next appointment:   In Person  Provider:   [id number], MD

## 2019-10-28 NOTE — Progress Notes (Signed)
Cardiology Office Note    Date:  11/03/2019   ID:  Bethany Baker, DOB 04-02-1965, MRN [id number]  PCP:  Jani Gravel, MD  Cardiologist:  Shelva Majestic, MD   F/U cardiology evaluation  History of Present Illness:  Bethany Baker is a 54 y.o. female who was referred through the courtesy of Dr. Jani Gravel for evaluation of aortic insufficiency. I saw her in January 2019 and in March 2021.  She presents for follow-up office visit.   Bethany Baker reportedly has a history of mitral valve prolapse and aortic regurgitation, and in the past was felt to possibly have a bicuspid aortic valve.  In January 2001, she underwent a nuclear perfusion study after she developed complaints of chest pain radiating to her left arm with mild shortness of breath.  This revealed normal perfusion without scar or ischemia.  An ejection fraction of 64%.  She had remotely seen Dr. Tami Ribas.  She underwent a carotid duplex study in March 2005 which was mildly abnormal.  At the origin of the left carotid and left subclavian.  The aorta measured 4 cm.  There was no significant diameter reduction in the carotid arteries.  She had undergone an echo Doppler study in June 2013 which showed an EF of 60-65% with grade 1 diastolic dysfunction.  There was moderate central aortic insufficiency with a posteriorly directed jet that impaired excursion of the anterior mitral valve leaflet.  Her mitral valve had a parachute appearance with late systolic prolapse in the anterior mitral leaflet reduced leaflet excursion.  The peak gradient was 10 mm.  She has not had recent follow-up of her valvular pathology.  She had recently seen Dr. Jani Gravel.  He did appreciated a progressive aortic insufficiency murmur.  She had noticed some shortness of breath with activity and denies palpitations.  .  When I initially saw her, she told me that she was originally diagnosed of as having obstructive sleep apnea and had undergone an initial sleep study in 2005  which showed an AHI of 11 per hour.  She apparently underwent a CPAP titration in 2011 and was titrated up to 16 cm water pressure.  She had not used CPAP in over 5 years.  She currently admits to nonrestorative sleep, fatigue, and was waking upt 2-3 times per night with nocturia.   She was unaware of any nocturnal palpitations.    She underwent an echo Doppler study in 10/14/2016 which showed normal systolic function with an EF of 55-60%.  There was grade 1 diastolic dysfunction.  There was a thickened aortic valve with mild-to-moderate aortic insufficiency, mild prolapse of anterior mitral valve leaflet with trace MR, and her ascending aorta was found to be dilated at 4.6 cm.  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  Carotid duplex imaging showed soft plaque in the right bifurcation with 1-39% stenosis.  There was no dilation at the origin of the left carotid artery and subclavian artery measuring 3.0 x 3.0 cm.  She had normal subclavian arteries bilaterally, and patent vertebral arteries with antegrade flow.  She  underwent a F/U sleep study on 11/29/2016.  Overall AHI was 2.9, but her RDI was 11.5, which was similar to her remote AHI of years ago.  During REM sleep her AHI was 12.6/h.  Although she felt that she did not sleep well that night, she had 82.7% sleep efficiency.  Wake after sleep onset was  only 47.1 minutes.  REM latency was 75 minutes.   In light of her ascending aneurysm.  I recommended institution of irbesartan 150 mg daily, which be hopeful both for blood pressure control as well as her diastolic dysfunction.  I referred her to Dr. 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  At present, there is no indication for surgery at this time, but she will require close follow-up.  Her CTA of her chest.  We repeated in 6 months to establish stability and then  to decide on longer-term surveillance.  Surgery would not be recommended until the aneurysm was 5.5 cm, or there was a period of rapid enlargement of 5 mm over a year.  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  Typically she works the night shift, usually from 8 PM to 8 AM but at times has to work additional shifts commencing at 6 PM.  The days that she is not working she is switching her sleep cycle and as result, has not established a good circadian rhythm. .   I last saw her in March 2021 after not having seen her over the previous 2 years.  She has continued to be followed by her rheumatologist  mixed connective tissue disease.  She continues to work on Friday Saturday and Sunday evening shift from 8 PM to 8 AM.  Most of the time she is not working she typically is sleeping and essentially sleeps all of Monday.  She admits to loud snoring.  She has recently noticed some shortness of breath if she carries heavy boxes.  In addition she has noticed recent blood pressure elevation with blood pressures at home typically running in the 145-155 range.  I was concerned that she had obstructive sleep apnea and recommended she undergo definitive sleep study.  Bethany Baker underwent a a follow-up split-night sleep study on June 10, 2019.  She was found to have mild to moderate overall sleep apnea on the diagnostic portion of the study with an AHI of 14.4 and RDI of 21.4/h; however events were moderately severe during REM sleep with an AHI of 29.3/h.  CPAP was initiated at 5 cm and was titrated to 18 cm of water pressure but AHI remained elevated at 15.6.  Due to continued events BiPAP was initiated and  was titrated up to 20/16.  It was recommended that she initiate BiPAP with a minimum EPAP pressure of 15 with pressure support of 4 and maximum IPAP pressure of 25.  Her BiPAP set up date was May 17, 2019 with [facility] as her DME company.  A  download was obtained in the office today from July 31 through October 27, 2019 which shows that she is meeting compliance standards with excellent usage.  Average usage is 6 hours and 33 minutes.  Her 95th percentile pressure is 19. 5/15.5.  AHI is 9.2 with an apnea index of 8.8 and central index 8. Since initiating BiPAP she is sleeping better.  Unfortunately her alteration of night and day sleep has not allowed her to get into a normal sleep pattern.  She often sleeps from noon until 6 PM.  If she is not working at night then she sleeps at night.  A new Epworth Sleepiness Scale score was calculated in the office today and this endorsed at 7 arguing against residual daytime sleepiness.  She presents  for evaluation.    Past Medical History:  Diagnosis Date  . Aortic insufficiency   . Depression   . Fibromyalgia   . MR (mitral regurgitation)   . MVP (mitral valve prolapse)   . OSA (obstructive sleep apnea) 06/10/2019  . Systolic murmur   . Thyroid disease    HYPOTHYROIDISM    Past Surgical History:  Procedure Laterality Date  . TRANSESOPHAGEAL ECHOCARDIOGRAM      Current Medications: Outpatient Medications Prior to Visit  Medication Sig Dispense Refill  . albuterol (PROVENTIL HFA;VENTOLIN HFA) 108 (90 Base) MCG/ACT inhaler Inhale 2 puffs into the lungs every 6 (six) hours as needed for wheezing or shortness of breath.    . ALPRAZolam (XANAX) 0.5 MG tablet Take 0.5 mg by mouth at bedtime as needed for anxiety.    Marland Kitchen amphetamine-dextroamphetamine (ADDERALL) 15 MG tablet Take by mouth daily. Pt takes 0.5 tablet    . esomeprazole (NEXIUM) 20 MG capsule Take 20 mg by mouth daily at 12 noon.    Marland Kitchen FLUoxetine (PROZAC) 40 MG capsule   4  . irbesartan (AVAPRO) 300 MG tablet Take 1 tablet (300 mg total) by mouth daily. 90 tablet 3  . ketorolac (TORADOL) 10 MG tablet as needed.  0  . levothyroxine (SYNTHROID, LEVOTHROID) 50 MCG tablet Take 50 mcg by mouth daily before breakfast. Takes additional 1.5  on sundays    . lidocaine (LIDODERM) 5 % Place 1 patch onto the skin daily. Remove & Discard patch within 12 hours or as directed by MD    . liothyronine (CYTOMEL) 5 MCG tablet Take 5 mcg by mouth daily.    Marland Kitchen loratadine (CLARITIN) 10 MG tablet Take 10 mg by mouth daily.    . methocarbamol (ROBAXIN) 500 MG tablet Take 500 mg by mouth 4 (four) times daily.    . montelukast (SINGULAIR) 10 MG tablet Take 10 mg by mouth at bedtime.    . Multiple Vitamin (MULTIVITAMIN) tablet Take 1 tablet by mouth daily.    . traMADol (ULTRAM) 50 MG tablet Take 50 mg by mouth every 6 (six) hours as needed.    . metoprolol succinate (TOPROL-XL) 25 MG 24 hr tablet Take 1 tablet (25 mg total) by mouth daily. 90 tablet 3   No facility-administered medications prior to visit.     Allergies:   Acetaminophen, Adhesive [tape], Aspirin, Effexor [venlafaxine], Eggs or egg-derived products, Ibuprofen, Mercury, Wellbutrin [bupropion], and Atenolol   Social History   Socioeconomic History  . Marital status: Single    Spouse name: Not on file  . Number of children: Not on file  . Years of education: Not on file  . Highest education level: Not on file  Occupational History  . Not on file  Tobacco Use  . Smoking status: Never Smoker  . Smokeless tobacco: Never Used  Substance and Sexual Activity  . Alcohol use: No  . Drug use: No  . Sexual activity: Not on file  Other Topics Concern  . Not on file  Social History Narrative  . Not on file   Social Determinants of Health   Financial Resource Strain:   . Difficulty of Paying Living Expenses: Not on file  Food Insecurity:   . Worried About Charity fundraiser in the Last Year: Not on file  . Ran Out of Food in the Last Year: Not on file  Transportation Needs:   . Lack of Transportation (Medical): Not on file  . Lack of Transportation (Non-Medical): Not on file  Physical Activity:   . Days of Exercise per Week: Not on file  . Minutes of Exercise per Session:  Not on file  Stress:   . Feeling of Stress : Not on file  Social Connections:   . Frequency of Communication with Friends and Family: Not on file  . Frequency of Social Gatherings with Friends and Family: Not on file  . Attends Religious Services: Not on file  . Active Member of Clubs or Organizations: Not on file  . Attends Archivist Meetings: Not on file  . Marital Status: Not on file     Family History:  The patient's family history includes Cancer in her mother; Fibromyalgia in her mother; Heart failure in her father; Hypertension in her brother and father; Mitral valve prolapse in her mother.   ROS General: Negative; No fevers, chills, or night sweats;  HEENT: Negative; No changes in vision or hearing, sinus congestion, difficulty swallowing Pulmonary: Negative; No cough, wheezing, shortness of breath, hemoptysis Cardiovascular: see HPI GI: Negative; No nausea, vomiting, diarrhea, or abdominal pain GU: Negative; No dysuria, hematuria, or difficulty voiding Musculoskeletal: Occasional leg cramps c Hematologic/Oncology: Negative; no easy bruising, bleeding Endocrine: Negative; no heat/cold intolerance; no diabetes Neuro: Negative; no changes in balance, headaches Skin: Negative; No rashes or skin lesions Psychiatric: Negative; No behavioral problems, depression Sleep: Positive for  sleep apnea, fatigue, nonrestorative sleep, nocturia and snoring; no bruxism, restless legs, hypnogognic hallucinations, no cataplexy Other comprehensive 14 point system review is negative.   Epworth Sleepiness Scale score calculated at her last office visit in October 2018 and endorsed at 6.   Epworth Sleepiness Scale: 05/21/2019 Situation   Chance of Dozing/Sleeping (0 = never , 1 = slight chance , 2 = moderate chance , 3 = high chance )   sitting and reading 2   watching TV 3   sitting inactive in a public place 1   being a passenger in a motor vehicle for an hour or more 2   lying  down in the afternoon 2   sitting and talking to someone 0   sitting quietly after lunch (no alcohol) 1   while stopped for a few minutes in traffic as the driver 0   Total Score  11   Epworth sleepiness scale score calculated in the office today October 28, 2019 endorsed at 7  PHYSICAL EXAM:   VS:  BP (!) 170/92   Pulse 86   Ht 5' 4.5" (1.638 m)   Wt 177 lb (80.3 kg)   SpO2 97%   BMI 29.91 kg/m     Blood pressure by me was elevated at 174/90  Wt Readings from Last 3 Encounters:  10/28/19 177 lb (80.3 kg)  06/10/19 170 lb (77.1 kg)  05/21/19 170 lb (77.1 kg)   General: Alert, oriented, no distress.  Skin: normal turgor, no rashes, warm and dry HEENT: Normocephalic, atraumatic. Pupils equal round and reactive to light; sclera anicteric; extraocular muscles intact;  Nose without nasal septal hypertrophy Mouth/Parynx benign; Mallinpatti scale 3 Neck: No JVD, no carotid bruits; normal carotid upstroke Lungs: clear to ausculatation and percussion; no wheezing or rales Chest wall: without tenderness to palpitation Heart: PMI not displaced, RRR, s1 s2 normal, 7-8/6 systolic murmur, no diastolic murmur, no rubs, gallops, thrills, or heaves Abdomen: soft, nontender; no hepatosplenomehaly, BS+; abdominal aorta nontender and not dilated by palpation. Back: no CVA tenderness Pulses 2+ Musculoskeletal: full range of motion, normal strength, no joint deformities Extremities: no clubbing cyanosis  or edema, Homan's sign negative  Neurologic: grossly nonfocal; Cranial nerves grossly wnl Psychologic: Normal mood and affect   Studies/Labs Reviewed:   EKG:  EKG is ordered today. ECG (independently read by me): Normal sinus rhythm 86 bpm, probable left atrial enlargement.  LVH by voltage.  Nonspecific ST changes.  QTc interval 466 ms.  March 2021ECG (independently read by me): Normal sinus rhythm at 87 bpm, probable left atrial enlargement, borderline LVH by voltage.  Nonspecific T wave  abnormality.  QTc interval 454 ms  January 2019 ECG (independently read by me): Normal sinus rhythm 84 bpm.  LVH by voltage criteria in aVL.  Nonspecific T changes.  October 2018 ECG (independently read by me): Normal sinus rhythm at 85 bpm.  Left axis deviation.  LVH by voltage criteria.  August 2018 ECG (independently read by me): Normal sinus rhythm at 85 bpm.  Borderline voltage for LVH.  T-wave abnormality in lead 3 and aVF.  Recent Labs: BMP Latest Ref Rng & Units 10/20/2008 09/07/2007 03/24/2007  Glucose 70 - 99 mg/dL 86 81 125(H)  BUN 6 - 23 mg/dL '9 10 15  ' Creatinine 0.40 - 1.20 mg/dL 0.70 0.84 0.9  Sodium 135 - 145 mEq/L 138 139 140  Potassium 3.5 - 5.1 mEq/L 3.5 3.7 3.7  Chloride 96 - 112 mEq/L 103 103 106  CO2 19 - 32 mEq/L 29 23 -  Calcium 8.4 - 10.5 mg/dL 8.7 8.9 -     Hepatic Function Latest Ref Rng & Units 10/20/2008 09/07/2007  Total Protein 6.0 - 8.3 g/dL 6.8 6.9  Albumin 3.5 - 5.2 g/dL 3.9 4.5  AST 0 - 37 U/L 31 17  ALT 0 - 35 U/L 25 12  Alk Phosphatase 39 - 117 U/L 74 61  Total Bilirubin 0.3 - 1.2 mg/dL 0.6 0.5  Bilirubin, Direct 0.0 - 0.3 mg/dL - 0.1    CBC Latest Ref Rng & Units 10/20/2008 12/17/2007 03/24/2007  WBC 4.0 - 10.5 K/uL 5.7 5.3 -  Hemoglobin 12.0 - 15.0 g/dL 12.0 12.4 14.3  Hematocrit 36 - 46 % 36.1 35.9(L) 42.0  Platelets 150 - 400 K/uL 227 220 -   Lab Results  Component Value Date   MCV 89.8 10/20/2008   MCV 99.6 12/17/2007   Lab Results  Component Value Date   TSH 2.956 09/07/2007   No results found for: HGBA1C   BNP No results found for: BNP  ProBNP No results found for: PROBNP   Lipid Panel     Component Value Date/Time   CHOL 186 09/07/2007 2130   TRIG 80 09/07/2007 2130   HDL 72 09/07/2007 2130   CHOLHDL 2.6 Ratio 09/07/2007 2130   VLDL 16 09/07/2007 2130   LDLCALC 98 09/07/2007 2130     RADIOLOGY: No results found.   Additional studies/ records that were reviewed today include:  I reviewed the  Remote nuclear  perfusion study, carotid duplex study, and the patient's last 2-D echo Doppler study from 2013.  I also reviewed her prior sleep evaluations and last CPAP titration trial of 2011.  I reviewed the records from Dr. Cyndia Bent , as well as her CT angiogram,  echo Doppler data. A new Epworth Sleepiness Scale score was calculated in the office today   ECHO 02/28/2020 IMPRESSIONS  1. Left ventricular ejection fraction, by visual estimation, is 60 to  65%. The left ventricle has normal function. There is no left ventricular  hypertrophy.  2. Left ventricular diastolic parameters are consistent with Grade I  diastolic dysfunction (impaired relaxation).  3. The left ventricle has no regional wall motion abnormalities.  4. Global right ventricle has normal systolic function.The right  ventricular size is normal. No increase in right ventricular wall  thickness.  5. Left atrial size was normal.  6. Right atrial size was normal.  7. Trivial pericardial effusion is present.  8. The mitral valve is normal in structure. Trivial mitral valve  regurgitation. No evidence of mitral stenosis.  9. The tricuspid valve is normal in structure.  10. Aortic valve regurgitation is moderate.  11. The aortic valve is normal in structure. Aortic valve regurgitation is  moderate. Mild aortic valve stenosis.  12. The pulmonic valve was normal in structure. Pulmonic valve  regurgitation is trivial.  13. Aortic dilatation noted.  14. 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  15. Normal pulmonary artery systolic pressure.  16. The inferior vena cava is normal in size with greater than 50%  respiratory variability, suggesting right atrial pressure of 3 mmHg.   ASSESSMENT:    1. Essential hypertension   2. OSA (obstructive sleep apnea)   3. Aortic valve insufficiency, etiology of cardiac valve disease unspecified   4. Aortic valve stenosis, etiology of cardiac valve disease unspecified   5.  Acquired dilation of ascending aorta and aortic root (HCC)   6. Hypothyroidism, unspecified type   7. MVP (mitral valve prolapse)   8. H/O mixed connective tissue disease     PLAN:   Robert Sunga is a very pleasant 54 year-old female who has a history of mitral valve prolapse and previously noted at least moderate aortic insufficiency.  She also has a history of sleep apnea and had been on CPAP therapy remotely and when I initially saw her she had not used CPAP in many years . On a prior carotid duplex study in 2005, she was noted to have some borderline aortic dilation.  When I initially saw her, she had a murmur of at least moderate aortic insufficiency as well as MR from mitral valve prolapse.  I reviewed her 5 year follow-up echo Doppler study with her in detail.  This confirmed aortic valve thickening with a mean gradient of 8, peak gradient of 14, and at least mild-to-moderate aortic insufficiency.  There was mild prolapse of anterior mitral valve leaflet with no stenosis and only trivial MR.  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  Subsequent CT angiography suggested this to be 4.7 cm involving the ascending thoracic aorta.  She underwent evaluation by Dr. Cyndia Bent and continues to see him for follow-up evaluations with his last visit in December 2020.  It is recommended that she have yearly echocardiograms for follow-up of her ascending aortic aneurysm as well as her moderate aortic insufficiency.  Her recent echo from February 28, 2019 continues to show normal systolic function with grade 1 diastolic dysfunction, moderate aortic regurgitation with mild aortic stenosis and dilation of her ascending aorta measuring 46 mm.  At present, there is no surgical indication for repair and she will undergo follow-up CT angiography study to assess stability and to determine frequency of future evaluations.  When I last saw her her blood pressure was elevated.  Blood pressure today continues to be elevated  despite being on irbesartan 300 mg daily and metoprolol succinate 25 mg daily.  Repeat blood pressure by me was 160/88 and her pulse was 86.  I have recommended increasing metoprolol succinate to 50 mg daily.  She continues  to have a 2/6 systolic murmur consistent with her aortic regurgitation and has aortic root dilation.  I reviewed her sleep study in detail as well as her download.  I am recommending slight adjustment of her pressure support up to 5 cm to see if this improves her AHI as well as central events.  Further titration may be necessary.  Unfortunately some of her difficulty with sleep is confounded by sleeping during the daytime several days per week and at nighttime other days per week.  She continues to be on levothyroxine for hypothyroidism and is on Nexium for GERD.  I am recommending she undergo a follow-up echo Doppler study in December to reassess her aortic regurgitation and aortic root size.  I will see her in January 2022 for follow-up evaluation.   Medication Adjustments/Labs and Tests Ordered: Current medicines are reviewed at length with the patient today.  Concerns regarding medicines are outlined above.  Medication changes, Labs and Tests ordered today are listed in the Patient Instructions below. Patient Instructions  Medication Instructions:  INCREASE YOUR METOPROLOL SUCCINATE TO 50MG DAILY  *If you need a refill on your cardiac medications before your next appointment, please call your pharmacy*    Testing/Procedures: IN DECEMBER Your physician has requested that you have an echocardiogram. Echocardiography is a painless test that uses sound waves to create images of your heart. It provides your doctor with information about the size and shape of your heart and how well your heart's chambers and valves are working. This procedure takes approximately one hour. There are no restrictions for this procedure.     Follow-Up: At [facility], you and your health needs  are our priority.  As part of our continuing mission to provide you with exceptional heart care, we have created designated Provider Care Teams.  These Care Teams include your primary Cardiologist (physician) and Advanced Practice Providers (APPs -  Physician Assistants and Nurse Practitioners) who all work together to provide you with the care you need, when you need it.  We recommend signing up for the patient portal called "MyChart".  Sign up information is provided on this After Visit Summary.  MyChart is used to connect with patients for Virtual Visits (Telemedicine).  Patients are able to view lab/test results, encounter notes, upcoming appointments, etc.  Non-urgent messages can be sent to your provider as well.   To learn more about what you can do with MyChart, go to [email or web address].    Your next appointment:  IN JANUARY 5 month(s)   The format for your next appointment:   In Person  Provider:   Shelva Majestic, MD       Signed, Shelva Majestic, MD  11/03/2019 3:48 PM    [facility] [address], [place], [place], [place]  [address] Phone: [phone]

## 2019-11-03 ENCOUNTER — Encounter: Payer: Self-pay | Admitting: Cardiovascular Disease

## 2019-12-03 ENCOUNTER — Other Ambulatory Visit (HOSPITAL_BASED_OUTPATIENT_CLINIC_OR_DEPARTMENT_OTHER): Payer: Self-pay | Admitting: Internal Medicine

## 2019-12-03 DIAGNOSIS — E039 Hypothyroidism, unspecified: Secondary | ICD-10-CM | POA: Diagnosis not present

## 2019-12-03 DIAGNOSIS — M1990 Unspecified osteoarthritis, unspecified site: Secondary | ICD-10-CM | POA: Diagnosis not present

## 2019-12-03 DIAGNOSIS — M797 Fibromyalgia: Secondary | ICD-10-CM | POA: Diagnosis not present

## 2019-12-03 DIAGNOSIS — I10 Essential (primary) hypertension: Secondary | ICD-10-CM | POA: Diagnosis not present

## 2019-12-03 DIAGNOSIS — Z0000 Encounter for general adult medical examination without abnormal findings: Secondary | ICD-10-CM | POA: Diagnosis not present

## 2019-12-03 DIAGNOSIS — K219 Gastro-esophageal reflux disease without esophagitis: Secondary | ICD-10-CM | POA: Diagnosis not present

## 2019-12-03 DIAGNOSIS — F329 Major depressive disorder, single episode, unspecified: Secondary | ICD-10-CM | POA: Diagnosis not present

## 2019-12-03 DIAGNOSIS — M25512 Pain in left shoulder: Secondary | ICD-10-CM | POA: Diagnosis not present

## 2019-12-03 DIAGNOSIS — E785 Hyperlipidemia, unspecified: Secondary | ICD-10-CM | POA: Diagnosis not present

## 2019-12-03 MED FILL — SYNTHROID 50 MCG TABLET: 50 | 86 days supply | Qty: 90 | Fill #0

## 2019-12-03 MED FILL — METHOCARBAMOL 500 MG TABS: 500 | 30 days supply | Qty: 90 | Fill #0

## 2019-12-03 MED FILL — LIOTHYRONINE SODIUM 5 MCG T: 5 | 90 days supply | Qty: 180 | Fill #0

## 2019-12-03 MED FILL — PANTOPRAZOLE SOD DR 40 MG T: 40 | 90 days supply | Qty: 90 | Fill #0

## 2019-12-03 MED FILL — KETOROLAC 10 MG TABLET: 10 | 30 days supply | Qty: 60 | Fill #0

## 2019-12-03 MED FILL — FLUoxetine HCL 40 MG CAPS: 40 | 90 days supply | Qty: 90 | Fill #0

## 2019-12-11 MED FILL — ALPRAZolam 0.5 MG TABS: 0.5 | 90 days supply | Qty: 90 | Fill #0

## 2019-12-11 MED FILL — traMADol HCL 50 MG TABS: 50 | 90 days supply | Qty: 90 | Fill #0

## 2019-12-16 DIAGNOSIS — M542 Cervicalgia: Secondary | ICD-10-CM | POA: Diagnosis not present

## 2019-12-16 DIAGNOSIS — M7542 Impingement syndrome of left shoulder: Secondary | ICD-10-CM | POA: Diagnosis not present

## 2020-01-13 ENCOUNTER — Other Ambulatory Visit: Payer: Self-pay | Admitting: Surgery

## 2020-01-13 DIAGNOSIS — I712 Thoracic aortic aneurysm, without rupture, unspecified: Secondary | ICD-10-CM

## 2020-02-04 ENCOUNTER — Other Ambulatory Visit (HOSPITAL_BASED_OUTPATIENT_CLINIC_OR_DEPARTMENT_OTHER): Payer: Self-pay | Admitting: Internal Medicine

## 2020-02-04 MED FILL — ALBUTEROL SULFATE HFA 108 (: 108 (90 BAS | 90 days supply | Qty: 18 | Fill #0

## 2020-02-04 MED FILL — MONTELUKAST SOD 10 MG TAB: 10 | 90 days supply | Qty: 90 | Fill #0

## 2020-02-05 ENCOUNTER — Other Ambulatory Visit (HOSPITAL_COMMUNITY): Payer: 59

## 2020-02-05 MED FILL — PSEUDOEPH-BROMPHEN-DM 30-2-: 30-2-10 | 13 days supply | Qty: 200 | Fill #0

## 2020-02-12 ENCOUNTER — Ambulatory Visit: Payer: 59 | Admitting: Surgery

## 2020-02-12 ENCOUNTER — Other Ambulatory Visit: Payer: 59

## 2020-03-03 ENCOUNTER — Other Ambulatory Visit: Payer: Self-pay

## 2020-03-03 ENCOUNTER — Ambulatory Visit (HOSPITAL_COMMUNITY): Payer: 59 | Attending: Cardiology

## 2020-03-03 DIAGNOSIS — I77819 Aortic ectasia, unspecified site: Secondary | ICD-10-CM | POA: Insufficient documentation

## 2020-03-03 DIAGNOSIS — I351 Nonrheumatic aortic (valve) insufficiency: Secondary | ICD-10-CM

## 2020-03-03 DIAGNOSIS — G4733 Obstructive sleep apnea (adult) (pediatric): Secondary | ICD-10-CM | POA: Diagnosis not present

## 2020-03-03 DIAGNOSIS — I77810 Thoracic aortic ectasia: Secondary | ICD-10-CM

## 2020-03-03 DIAGNOSIS — I350 Nonrheumatic aortic (valve) stenosis: Secondary | ICD-10-CM

## 2020-03-03 LAB — ECHOCARDIOGRAM COMPLETE
AR max vel: 2.57 cm2
AV Area VTI: 2.62 cm2
AV Area mean vel: 2.56 cm2
AV Mean grad: 9 mmHg
AV Peak grad: 15.2 mmHg
Ao pk vel: 1.95 m/s
Area-P 1/2: 3.6 cm2
P 1/2 time: 301 msec
S' Lateral: 3.6 cm

## 2020-03-04 ENCOUNTER — Ambulatory Visit: Payer: 59 | Admitting: Surgery

## 2020-03-04 ENCOUNTER — Encounter: Payer: Self-pay | Admitting: Surgery

## 2020-03-04 ENCOUNTER — Ambulatory Visit
Admission: RE | Admit: 2020-03-04 | Discharge: 2020-03-04 | Disposition: A | Discharge status: Home / Self Care | Payer: 59 | Source: Ambulatory Visit | Attending: Surgery | Admitting: Surgery

## 2020-03-04 VITALS — BP 137/81 | HR 78 | Resp 20 | Ht 64.0 in | Wt 169.4 lb

## 2020-03-04 DIAGNOSIS — I712 Thoracic aortic aneurysm, without rupture, unspecified: Secondary | ICD-10-CM

## 2020-03-04 MED ORDER — IOPAMIDOL (ISOVUE-370) INJECTION 76%
75.0000 mL | Freq: Once | INTRAVENOUS | Status: AC | PRN
  Administered 2020-03-04: 75 mL via INTRAVENOUS

## 2020-03-06 NOTE — Progress Notes (Signed)
HPI:  The patient is a 55 year old woman with a stable 4.5 cm fusiform ascending aortic aneurysm and moderate aortic insufficiency by echocardiogram dating back to 07/2011.  She had a recent follow-up echocardiogram on 03/03/2020 which showed progression to severe aortic insufficiency with a pressure half-time of 301 ms.  Her echocardiogram 1 year ago on 02/28/2019 showed a AI pressure half-time of 297 ms and aortic insufficiency was read as moderate.  She continues to feel well overall without shortness of breath.  She does note some fatigue if she is really active.  She has had no chest pain or pressure.  She denies orthopnea and peripheral edema.  Current Outpatient Medications  Medication Sig Dispense Refill  . albuterol (PROVENTIL HFA;VENTOLIN HFA) 108 (90 Base) MCG/ACT inhaler Inhale 2 puffs into the lungs every 6 (six) hours as needed for wheezing or shortness of breath.    . ALPRAZolam (XANAX) 0.5 MG tablet Take 0.5 mg by mouth at bedtime as needed for anxiety.    Marland Kitchen esomeprazole (NEXIUM) 20 MG capsule Take 20 mg by mouth daily at 12 noon.    Marland Kitchen FLUoxetine (PROZAC) 40 MG capsule   4  . irbesartan (AVAPRO) 300 MG tablet Take 1 tablet (300 mg total) by mouth daily. 90 tablet 3  . ketorolac (TORADOL) 10 MG tablet as needed.  0  . levothyroxine (SYNTHROID, LEVOTHROID) 50 MCG tablet Take 50 mcg by mouth daily before breakfast. Takes additional 1.5 on sundays    . lidocaine (LIDODERM) 5 % Place 1 patch onto the skin daily. Remove & Discard patch within 12 hours or as directed by MD    . liothyronine (CYTOMEL) 5 MCG tablet Take 5 mcg by mouth daily.    Marland Kitchen loratadine (CLARITIN) 10 MG tablet Take 10 mg by mouth daily.    . methocarbamol (ROBAXIN) 500 MG tablet Take 500 mg by mouth 4 (four) times daily.    . metoprolol succinate (TOPROL-XL) 50 MG 24 hr tablet Take 1 tablet (50 mg total) by mouth daily. 90 tablet 3  . montelukast (SINGULAIR) 10 MG tablet Take 10 mg by mouth at bedtime.    .  Multiple Vitamin (MULTIVITAMIN) tablet Take 1 tablet by mouth daily.    . traMADol (ULTRAM) 50 MG tablet Take 50 mg by mouth every 6 (six) hours as needed.    Marland Kitchen amphetamine-dextroamphetamine (ADDERALL) 15 MG tablet Take by mouth daily. Pt takes 0.5 tablet (Patient not taking: Reported on 03/04/2020)     No current facility-administered medications for this visit.     Physical Exam: BP 137/81 (BP Location: Left Arm, Patient Position: Sitting)   Pulse 78   Resp 20   Ht 5\' 4"  (1.626 m)   Wt 169 lb 6.4 oz (76.8 kg)   SpO2 95% Comment: RA with mask on  BMI 29.08 kg/m  She looks well. Cardiac exam shows a regular rate and rhythm with a normal S1 and S2.  There is a soft systolic murmur along the right sternal border.  There is no diastolic murmur. Lungs are clear. There is no peripheral edema.  Diagnostic Tests:  ECHOCARDIOGRAM REPORT       Patient Name:  Bethany Baker Date of Exam: 03/03/2020  Medical Rec #: [id number]/01/[id number]   Height:    64.5 in  Accession #:  [id number]  Weight:    177.0 lb  Date of Birth: 01/15/1966   BSA:     1.867 m  Patient Age:  50 years  BP:      170/92 mmHg  Patient Gender: F       HR:      68 bpm.  Exam Location: [address]   Procedure: 2D Echo, 3D Echo, Cardiac Doppler, Color Doppler and Strain  Analysis   Indications:  I35 Aortic stenosis.    History:    Patient has prior history of Echocardiogram examinations,  most         recent 02/28/2019. Mitral Valve Prolapse,  Signs/Symptoms:Murmur;         Risk Factors:Sleep Apnea.    Sonographer:  Jessee Avers, RDCS  Referring Phys: [place]    1. Left ventricular ejection fraction, by estimation, is 60 to 65%. The  left ventricle has normal function. The left ventricle has no regional  wall motion abnormalities. The left ventricular internal cavity size was  mildly dilated. Left ventricular  diastolic  parameters are consistent with Grade I diastolic dysfunction  (impaired relaxation).  2. Right ventricular systolic function is normal. The right ventricular  size is normal.  3. The mitral valve is normal in structure. Trivial mitral valve  regurgitation. No evidence of mitral stenosis.  4. The aortic valve has an indeterminant number of cusps. Aortic valve  regurgitation is severe. No aortic stenosis is present.  5. Aortic dilatation noted. There is moderate dilatation of the ascending  aorta, measuring 46 mm.  6. The inferior vena cava is normal in size with greater than 50%  respiratory variability, suggesting right atrial pressure of 3 mmHg.   Comparison(s): 02/28/19 EF 60-65%. Mild AS 14 mmHg mean PG, 20 mmHg peak  PG. Ascending aortic dimension 46 mm.   FINDINGS  Left Ventricle: Left ventricular ejection fraction, by estimation, is 60  to 65%. The left ventricle has normal function. The left ventricle has no  regional wall motion abnormalities. The left ventricular internal cavity  size was mildly dilated. There is  no left ventricular hypertrophy. Left ventricular diastolic parameters  are consistent with Grade I diastolic dysfunction (impaired relaxation).   Right Ventricle: The right ventricular size is normal. No increase in  right ventricular wall thickness. Right ventricular systolic function is  normal.   Left Atrium: Left atrial size was normal in size.   Right Atrium: Right atrial size was normal in size.   Pericardium: There is no evidence of pericardial effusion.   Mitral Valve: The mitral valve is normal in structure. Trivial mitral  valve regurgitation. No evidence of mitral valve stenosis.   Tricuspid Valve: The tricuspid valve is normal in structure. Tricuspid  valve regurgitation is trivial. No evidence of tricuspid stenosis.   Aortic Valve: The aortic valve has an indeterminant number of cusps.  Aortic valve regurgitation is severe. Aortic  regurgitation PHT measures  301 msec. No aortic stenosis is present. Aortic valve mean gradient  measures 9.0 mmHg. Aortic valve peak gradient  measures 15.2 mmHg. Aortic valve area, by VTI measures 2.62 cm.   Pulmonic Valve: The pulmonic valve was not well visualized. Pulmonic valve  regurgitation is not visualized. No evidence of pulmonic stenosis.   Aorta: Aortic dilatation noted. There is moderate dilatation of the  ascending aorta, measuring 46 mm.   Venous: The inferior vena cava is normal in size with greater than 50%  respiratory variability, suggesting right atrial pressure of 3 mmHg.   IAS/Shunts: No atrial level shunt detected by color flow Doppler.     LEFT VENTRICLE  PLAX 2D  LVIDd:  5.50 cm Diastology  LVIDs:     3.60 cm LV e' medial:  6.81 cm/s  LV PW:     0.90 cm LV E/e' medial: 15.9  LV IVS:    0.80 cm LV e' lateral:  7.37 cm/s  LVOT diam:   2.20 cm LV E/e' lateral: 14.7  LV SV:     121  LV SV Index:  65    2D Longitudinal Strain  LVOT Area:   3.80 cm 2D Strain GLS (A2C):  -27.7 %             2D Strain GLS (A3C):  -20.1 %             2D Strain GLS (A4C):  -27.2 %             2D Strain GLS Avg:   -25.0 %               3D Volume EF:             3D EF:    57 %             LV EDV:    140 ml             LV ESV:    61 ml             LV SV:    80 ml   RIGHT VENTRICLE  RV Basal diam: 2.20 cm  RV S prime:   11.20 cm/s  TAPSE (M-mode): 1.8 cm   LEFT ATRIUM       Index    RIGHT ATRIUM      Index  LA diam:    4.30 cm 2.30 cm/m RA Pressure: 3.00 mmHg  LA Vol (A2C):  44.5 ml 23.83 ml/m RA Area:   9.45 cm  LA Vol (A4C):  45.6 ml 24.42 ml/m RA Volume:  16.80 ml 9.00 ml/m  LA Biplane Vol: 45.2 ml 24.21 ml/m  AORTIC VALVE  AV Area (Vmax):  2.57 cm  AV Area (Vmean):   2.56 cm  AV Area (VTI):   2.62 cm  AV Vmax:      195.00 cm/s  AV Vmean:     141.000 cm/s  AV VTI:      0.460 m  AV Peak Grad:   15.2 mmHg  AV Mean Grad:   9.0 mmHg  LVOT Vmax:     132.00 cm/s  LVOT Vmean:    95.000 cm/s  LVOT VTI:     0.317 m  LVOT/AV VTI ratio: 0.69  AI PHT:      301 msec    AORTA  Ao Root diam: 4.00 cm  Ao Asc diam: 4.60 cm   MITRAL VALVE        TRICUSPID VALVE               Estimated RAP: 3.00 mmHg    MV E velocity: 108.00 cm/s SHUNTS  MV A velocity: 118.00 cm/s Systemic VTI: 0.32 m  MV E/A ratio: 0.92     Systemic Diam: 2.20 cm   Olga Millers MD  Electronically signed by Olga Millers MD  Signature Date/Time: 03/03/2020/1:24:22 PM     CLINICAL DATA:  Thoracic aorta aneurysm follow-up  EXAM: CT ANGIOGRAPHY CHEST WITH CONTRAST  TECHNIQUE: Multidetector CT imaging of the chest was performed using the standard protocol during bolus administration of intravenous contrast. Multiplanar CT image reconstructions and MIPs were obtained to evaluate the vascular anatomy.  CONTRAST:  52mL ISOVUE-370 IOPAMIDOL (ISOVUE-370) INJECTION 76%  COMPARISON:  December 2020  FINDINGS: Cardiovascular: Ascending thoracic aorta again measures up to 4.5 cm with exact measurement limited by pulsation artifact. Normal heart size. Unchanged trace pericardial effusion.  Mediastinum/Nodes: Small hiatal hernia. No enlarged lymph nodes. Thyroid is unremarkable.  Lungs/Pleura: No consolidation or mass. No pleural effusion or pneumothorax.  Upper Abdomen: No acute abnormality.  Musculoskeletal: No acute osseous abnormality.  Review of the MIP images confirms the above findings.  IMPRESSION: Stable dilatation of the ascending thoracic aorta. Recommend semi-annual imaging followup by CTA or MRA. This recommendation follows 2010 ACCF/AHA/AATS/ACR/ASA/SCA/SCAI/SIR/STS/SVM  Guidelines for the Diagnosis and Management of Patients With Thoracic Aortic Disease. Circulation. 2010; 121: Q722-V750. Aortic aneurysm NOS (ICD10-I71.9)   Electronically Signed   By: Guadlupe Spanish M.D.   On: 03/04/2020 08:50   Impression:  This 55 year old woman has a stable 4.5 cm fusiform ascending aortic aneurysm which is well below the surgical threshold of 5.5 cm.  Her recent echocardiogram shows stable moderate to severe aortic insufficiency with a pressure half-time of 301 ms.  Her LV diastolic dimension is 5.5 cm and the systolic dimension is 3.6 cm.  These have gradually increased over the past 3 years.  Left ventricular ejection fraction is 60 to 65% with grade 1 diastolic dysfunction.  She denies any significant symptoms but does report some fatigue with heavy exertion which does not sound like it is new.  She does not meet the criteria for aortic valve replacement by the 2020 [facility] guideline but does require close follow-up.  She should have another echocardiogram in 6 months to reevaluate her left ventricular function and internal dimensions.  I discussed the symptoms of aortic insufficiency with her so that she will be familiar if she develops any progression.  I reviewed the CT and echo images with her and answered all of her questions.  Plan:  I will plan to see her back in 1 year with a CTA of the chest to follow-up on her aortic aneurysm.  She will continue to follow-up with Dr. Tresa Endo and should have another echocardiogram in 6 months.  She will require replacement of her ascending aorta and aortic valve at some point in the not too distant future.  I discussed the importance of close follow-up with her to avoid progressive left ventricular deterioration.  I spent  20 minutes performing this established patient evaluation and > 50% of this time was spent face to face counseling and coordinating the care of this patient's aortic aneurysm and moderate to severe aortic  insufficiency.    Alleen Borne, MD [facility] [phone]

## 2020-03-23 ENCOUNTER — Ambulatory Visit: Payer: 59 | Admitting: Cardiovascular Disease

## 2020-03-23 ENCOUNTER — Encounter: Payer: Self-pay | Admitting: Cardiovascular Disease

## 2020-03-23 ENCOUNTER — Other Ambulatory Visit: Payer: Self-pay

## 2020-03-23 VITALS — BP 140/85 | HR 63 | Ht 64.0 in | Wt 173.0 lb

## 2020-03-23 DIAGNOSIS — I10 Essential (primary) hypertension: Secondary | ICD-10-CM

## 2020-03-23 DIAGNOSIS — G4733 Obstructive sleep apnea (adult) (pediatric): Secondary | ICD-10-CM

## 2020-03-23 DIAGNOSIS — I341 Nonrheumatic mitral (valve) prolapse: Secondary | ICD-10-CM

## 2020-03-23 DIAGNOSIS — I351 Nonrheumatic aortic (valve) insufficiency: Secondary | ICD-10-CM

## 2020-03-23 DIAGNOSIS — Z87898 Personal history of other specified conditions: Secondary | ICD-10-CM

## 2020-03-23 DIAGNOSIS — I77819 Aortic ectasia, unspecified site: Secondary | ICD-10-CM | POA: Diagnosis not present

## 2020-03-23 DIAGNOSIS — Z8739 Personal history of other diseases of the musculoskeletal system and connective tissue: Secondary | ICD-10-CM

## 2020-03-23 DIAGNOSIS — I77810 Thoracic aortic ectasia: Secondary | ICD-10-CM

## 2020-03-23 NOTE — Progress Notes (Signed)
Cardiology Office Note    Date:  03/23/2020   ID:  Bethany Baker, DOB December 01, 1965, MRN [id number]  PCP:  Jani Gravel, MD  Cardiologist:  Shelva Majestic, MD   F/U cardiology/sleep evaluation  History of Present Illness:  Bethany Baker is a 55 y.o. female who was referred through the courtesy of Dr. Jani Gravel for evaluation of aortic insufficiency. I last saw her in August 2021.  She presents for follow-up evaluation.  Bethany Baker reportedly has a history of mitral valve prolapse and aortic regurgitation, and in the past was felt to possibly have a bicuspid aortic valve.  In January 2001, she underwent a nuclear perfusion study after she developed complaints of chest pain radiating to her left arm with mild shortness of breath.  This revealed normal perfusion without scar or ischemia.  An ejection fraction of 64%.  She had remotely seen Dr. Tami Ribas.  She underwent a carotid duplex study in March 2005 which was mildly abnormal.  At the origin of the left carotid and left subclavian.  The aorta measured 4 cm.  There was no significant diameter reduction in the carotid arteries.  She had undergone an echo Doppler study in June 2013 which showed an EF of 60-65% with grade 1 diastolic dysfunction.  There was moderate central aortic insufficiency with a posteriorly directed jet that impaired excursion of the anterior mitral valve leaflet.  Her mitral valve had a parachute appearance with late systolic prolapse in the anterior mitral leaflet reduced leaflet excursion.  The peak gradient was 10 mm.  She has not had recent follow-up of her valvular pathology.  She had recently seen Dr. Jani Gravel.  He did appreciated a progressive aortic insufficiency murmur.  She had noticed some shortness of breath with activity and denies palpitations.  .  When I initially saw her, she told me that she was originally diagnosed of as having obstructive sleep apnea and had undergone an initial sleep study in 2005 which showed  an AHI of 11 per hour.  She apparently underwent a CPAP titration in 2011 and was titrated up to 16 cm water pressure.  She had not used CPAP in over 5 years.  She currently admits to nonrestorative sleep, fatigue, and was waking upt 2-3 times per night with nocturia.   She was unaware of any nocturnal palpitations.    She underwent an echo Doppler study in 10/14/2016 which showed normal systolic function with an EF of 55-60%.  There was grade 1 diastolic dysfunction.  There was a thickened aortic valve with mild-to-moderate aortic insufficiency, mild prolapse of anterior mitral valve leaflet with trace MR, and her ascending aorta was found to be dilated at 4.6 cm.  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  Carotid duplex imaging showed soft plaque in the right bifurcation with 1-39% stenosis.  There was no dilation at the origin of the left carotid artery and subclavian artery measuring 3.0 x 3.0 cm.  She had normal subclavian arteries bilaterally, and patent vertebral arteries with antegrade flow.  She  underwent a F/U sleep study on 11/29/2016.  Overall AHI was 2.9, but her RDI was 11.5, which was similar to her remote AHI of years ago.  During REM sleep her AHI was 12.6/h.  Although she felt that she did not sleep well that night, she had 82.7% sleep efficiency.  Wake after sleep onset was only 47.1 minutes.  REM  latency was 75 minutes.   In light of her ascending aneurysm.  I recommended institution of irbesartan 150 mg daily, which be hopeful both for blood pressure control as well as her diastolic dysfunction.  I referred her to Dr. 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  At present, there is no indication for surgery at this time, but she will require close follow-up.  Her CTA of her chest.  We repeated in 6 months to establish stability and then to decide on  longer-term surveillance.  Surgery would not be recommended until the aneurysm was 5.5 cm, or there was a period of rapid enlargement of 5 mm over a year.  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  Typically she works the night shift, usually from 8 PM to 8 AM but at times has to work additional shifts commencing at 6 PM.  The days that she is not working she is switching her sleep cycle and as result, has not established a good circadian rhythm. .   When saw her in March 2021 after not having seen her over the previous 2 years she continued to be followed by her rheumatologist for mixed connective tissue disease.  She continues to work on Friday Saturday and Sunday evening shift from 8 PM to 8 AM.  Most of the time she is not working she typically is sleeping and essentially sleeps all of Monday.  She admits to loud snoring.  She has recently noticed some shortness of breath if she carries heavy boxes.  In addition she has noticed recent blood pressure elevation with blood pressures at home typically running in the 145-155 range.  I was concerned that she had obstructive sleep apnea and recommended she undergo definitive sleep study.  Ms. Jablon underwent a follow-up split-night sleep study on June 10, 2019.  She was found to have mild to moderate overall sleep apnea on the diagnostic portion of the study with an AHI of 14.4 and RDI of 21.4/h; however events were moderately severe during REM sleep with an AHI of 29.3/h.  CPAP was initiated at 5 cm and was titrated to 18 cm of water pressure but AHI remained elevated at 15.6.  Due to continued events BiPAP was initiated and  was titrated up to 20/16.  It was recommended that she initiate BiPAP with a minimum EPAP pressure of 15 with pressure support of 4 and maximum IPAP pressure of 25.  Her BiPAP set up date was May 17, 2019 with [facility] as her DME company.    I saw her for follow-up on  October 28, 2019.  A download was obtained in the office from July 31 through October 27, 2019 which shows that she is meeting compliance standards with excellent usage.  Average usage is 6 hours and 33 minutes.  Her 95th percentile pressure is 19. 5/15.5.  AHI is 9.2 with an apnea index of 8.8 and central index 8. Since initiating BiPAP she is sleeping better.  Unfortunately her alteration of night and day sleep has not allowed her to get into a normal sleep pattern.  She often sleeps from noon until 6 PM.  If she is not working at night then she sleeps at night.  A new Epworth Sleepiness Scale score was calculated in the office today and this endorsed at 7 arguing against residual daytime sleepiness.  I  recommended slight adjustment of her pressure support up to 5 cm in attempt to see if this improved her AHI as well as central events.  In addition her blood pressure was elevated despite being on irbesartan 300 mg daily and metoprolol succinate 25 mg.  I recommended she increase metoprolol succinate to 50 mg.  She underwent a 2D echo Doppler study on March 03, 2020 which showed normal EF at 60 to 65%.  The echo now suggested severe aortic regurgitation.  Was no aortic stenosis.  There was moderate dilation of the ascending aorta measuring 46 mm.  This morning, underwent a follow-up CT angio of her chest and aorta on March 04, 2020  showed stable dilation of the ascending aorta at 4.5 cm.  She had seen Dr. Cyndia Bent on March 04, 2020 who felt that she did not meet criteria for aortic valve replacement by the 2020 [facility] guideline but requires close follow-up and it was recommended that she have another echocardiogram in 6 months to reevaluate her left ventricular function and internal dimensions.  A new download was obtained in the office today regarding her sleep apnea.  Showed reduced compliance with only 4 hours and 2 minutes of average use and usage days and only 12 of the last 30 days or 40%.  She continues to  have issues with alternating day and night shifts as result is not sleeping well.  On the days that she works evenings from 8 PM until 8 AM she often goes home in the morning, eats, gets to bed at 1:00 in the afternoon and wakes up at 630.  Her most recent download with pressures with a minimum EPAP of 14 and maximum IPAP of 25 shows never get AHI at 25.0 as well as significant central index of 24.4.  She presents for reevaluation.    Past Medical History:  Diagnosis Date  . Aortic insufficiency   . Depression   . Fibromyalgia   . MR (mitral regurgitation)   . MVP (mitral valve prolapse)   . OSA (obstructive sleep apnea) 06/10/2019  . Systolic murmur   . Thyroid disease    HYPOTHYROIDISM    Past Surgical History:  Procedure Laterality Date  . TRANSESOPHAGEAL ECHOCARDIOGRAM      Current Medications: Outpatient Medications Prior to Visit  Medication Sig Dispense Refill  . albuterol (PROVENTIL HFA;VENTOLIN HFA) 108 (90 Base) MCG/ACT inhaler Inhale 2 puffs into the lungs every 6 (six) hours as needed for wheezing or shortness of breath.    . ALPRAZolam (XANAX) 0.5 MG tablet Take 0.5 mg by mouth at bedtime as needed for anxiety.    Marland Kitchen esomeprazole (NEXIUM) 20 MG capsule Take 20 mg by mouth daily at 12 noon.    Marland Kitchen FLUoxetine (PROZAC) 40 MG capsule   4  . irbesartan (AVAPRO) 300 MG tablet Take 1 tablet (300 mg total) by mouth daily. 90 tablet 3  . ketorolac (TORADOL) 10 MG tablet as needed.  0  . levothyroxine (SYNTHROID, LEVOTHROID) 50 MCG tablet Take 50 mcg by mouth daily before breakfast. Takes additional 1.5 on sundays    . lidocaine (LIDODERM) 5 % Place 1 patch onto the skin daily. Remove & Discard patch within 12 hours or as directed by MD    . liothyronine (CYTOMEL) 5 MCG tablet Take 5 mcg by mouth daily.    Marland Kitchen loratadine (CLARITIN) 10 MG tablet Take 10 mg by mouth daily.    . methocarbamol (ROBAXIN) 500 MG tablet Take 500 mg by mouth 4 (  four) times daily.    . metoprolol succinate  (TOPROL-XL) 50 MG 24 hr tablet Take 1 tablet (50 mg total) by mouth daily. 90 tablet 3  . montelukast (SINGULAIR) 10 MG tablet Take 10 mg by mouth at bedtime.    . Multiple Vitamin (MULTIVITAMIN) tablet Take 1 tablet by mouth daily.    . traMADol (ULTRAM) 50 MG tablet Take 50 mg by mouth every 6 (six) hours as needed.    Marland Kitchen amphetamine-dextroamphetamine (ADDERALL) 15 MG tablet Take by mouth daily. Pt takes 0.5 tablet (Patient not taking: No sig reported)     No facility-administered medications prior to visit.     Allergies:   Acetaminophen, Adhesive [tape], Aspirin, Effexor [venlafaxine], Eggs or egg-derived products, Ibuprofen, Mercury, Wellbutrin [bupropion], and Atenolol   Social History   Socioeconomic History  . Marital status: Single    Spouse name: Not on file  . Number of children: Not on file  . Years of education: Not on file  . Highest education level: Not on file  Occupational History  . Not on file  Tobacco Use  . Smoking status: Never Smoker  . Smokeless tobacco: Never Used  Substance and Sexual Activity  . Alcohol use: No  . Drug use: No  . Sexual activity: Not on file  Other Topics Concern  . Not on file  Social History Narrative  . Not on file   Social Determinants of Health   Financial Resource Strain: Not on file  Food Insecurity: Not on file  Transportation Needs: Not on file  Physical Activity: Not on file  Stress: Not on file  Social Connections: Not on file     Family History:  The patient's family history includes Cancer in her mother; Fibromyalgia in her mother; Heart failure in her father; Hypertension in her brother and father; Mitral valve prolapse in her mother.   ROS General: Negative; No fevers, chills, or night sweats;  HEENT: Negative; No changes in vision or hearing, sinus congestion, difficulty swallowing Pulmonary: Negative; No cough, wheezing, shortness of breath, hemoptysis Cardiovascular: see HPI GI: Negative; No nausea,  vomiting, diarrhea, or abdominal pain GU: Negative; No dysuria, hematuria, or difficulty voiding Musculoskeletal: Occasional leg cramps c Hematologic/Oncology: Negative; no easy bruising, bleeding Endocrine: Negative; no heat/cold intolerance; no diabetes Neuro: Negative; no changes in balance, headaches Skin: Negative; No rashes or skin lesions Psychiatric: Negative; No behavioral problems, depression Sleep: Positive for  sleep apnea, fatigue, nonrestorative sleep, nocturia and snoring; no bruxism, restless legs, hypnogognic hallucinations, no cataplexy Other comprehensive 14 point system review is negative.   PHYSICAL EXAM:   VS:  BP 140/85 (BP Location: Left Arm, Patient Position: Sitting)   Pulse 63   Ht '5\' 4"'  (1.626 m)   Wt 173 lb (78.5 kg)   SpO2 99%   BMI 29.70 kg/m     Repeat blood pressure by me was 122/70  Wt Readings from Last 3 Encounters:  03/23/20 173 lb (78.5 kg)  03/04/20 169 lb 6.4 oz (76.8 kg)  10/28/19 177 lb (80.3 kg)    BP 140/85 (BP Location: Left Arm, Patient Position: Sitting)   Pulse 63   Ht '5\' 4"'  (1.626 m)   Wt 173 lb (78.5 kg)   SpO2 99%   BMI 29.70 kg/m  General: Alert, oriented, no distress.  Skin: normal turgor, no rashes, warm and dry HEENT: Normocephalic, atraumatic. Pupils equal round and reactive to light; sclera anicteric; extraocular muscles intact;  Nose without nasal septal hypertrophy Mouth/Parynx benign; Mallinpatti  scale 3 Neck: No JVD, no carotid bruits; normal carotid upstroke Lungs: clear to ausculatation and percussion; no wheezing or rales Chest wall: without tenderness to palpitation Heart: PMI not displaced, RRR, s1 s2 normal, 1/6 systolic murmur, 1/6 diastolic murmur, no rubs, gallops, thrills, or heaves Abdomen: soft, nontender; no hepatosplenomehaly, BS+; abdominal aorta nontender and not dilated by palpation. Back: no CVA tenderness Pulses 2+ Musculoskeletal: full range of motion, normal strength, no joint  deformities Extremities: no clubbing cyanosis or edema, Homan's sign negative  Neurologic: grossly nonfocal; Cranial nerves grossly wnl Psychologic: Normal mood and affect   Studies/Labs Reviewed:   EKG:  EKG is ordered today. ECG (independently read by me): Normal sinus rhythm at 63 bpm, LVH by voltage.  Nonspecific T wave abnormality.  Normal intervals, no ectopy.  August 2021 ECG (independently read by me): Normal sinus rhythm 86 bpm, probable left atrial enlargement.  LVH by voltage.  Nonspecific ST changes.  QTc interval 466 ms.  March 2021ECG (independently read by me): Normal sinus rhythm at 87 bpm, probable left atrial enlargement, borderline LVH by voltage.  Nonspecific T wave abnormality.  QTc interval 454 ms  January 2019 ECG (independently read by me): Normal sinus rhythm 84 bpm.  LVH by voltage criteria in aVL.  Nonspecific T changes.  October 2018 ECG (independently read by me): Normal sinus rhythm at 85 bpm.  Left axis deviation.  LVH by voltage criteria.  August 2018 ECG (independently read by me): Normal sinus rhythm at 85 bpm.  Borderline voltage for LVH.  T-wave abnormality in lead 3 and aVF.  Recent Labs: BMP Latest Ref Rng & Units 10/20/2008 09/07/2007 03/24/2007  Glucose 70 - 99 mg/dL 86 81 125(H)  BUN 6 - 23 mg/dL '9 10 15  ' Creatinine 0.4 - 1.2 mg/dL 0.70 0.84 0.9  Sodium 135 - 145 mEq/L 138 139 140  Potassium 3.5 - 5.1 mEq/L 3.5 3.7 3.7  Chloride 96 - 112 mEq/L 103 103 106  CO2 19 - 32 mEq/L 29 23 -  Calcium 8.4 - 10.5 mg/dL 8.7 8.9 -     Hepatic Function Latest Ref Rng & Units 10/20/2008 09/07/2007  Total Protein 6.0 - 8.3 g/dL 6.8 6.9  Albumin 3.5 - 5.2 g/dL 3.9 4.5  AST 0 - 37 U/L 31 17  ALT 0 - 35 U/L 25 12  Alk Phosphatase 39 - 117 U/L 74 61  Total Bilirubin 0.3 - 1.2 mg/dL 0.6 0.5  Bilirubin, Direct 0.0 - 0.3 mg/dL - 0.1    CBC Latest Ref Rng & Units 10/20/2008 12/17/2007 03/24/2007  WBC 4.0 - 10.5 K/uL 5.7 5.3 -  Hemoglobin 12.0 - 15.0 g/dL 12.0  12.4 14.3  Hematocrit 36.0 - 46.0 % 36.1 35.9(L) 42.0  Platelets 150 - 400 K/uL 227 220 -   Lab Results  Component Value Date   MCV 89.8 10/20/2008   MCV 99.6 12/17/2007   Lab Results  Component Value Date   TSH 2.956 09/07/2007   No results found for: HGBA1C   BNP No results found for: BNP  ProBNP No results found for: PROBNP   Lipid Panel     Component Value Date/Time   CHOL 186 09/07/2007 2130   TRIG 80 09/07/2007 2130   HDL 72 09/07/2007 2130   CHOLHDL 2.6 Ratio 09/07/2007 2130   VLDL 16 09/07/2007 2130   LDLCALC 98 09/07/2007 2130     RADIOLOGY: CT ANGIO CHEST AORTA W/CM & OR WO/CM  Result Date: 03/04/2020 CLINICAL DATA:  Thoracic aorta  aneurysm follow-up EXAM: CT ANGIOGRAPHY CHEST WITH CONTRAST TECHNIQUE: Multidetector CT imaging of the chest was performed using the standard protocol during bolus administration of intravenous contrast. Multiplanar CT image reconstructions and MIPs were obtained to evaluate the vascular anatomy. CONTRAST:  22m ISOVUE-370 IOPAMIDOL (ISOVUE-370) INJECTION 76% COMPARISON:  December 2020 FINDINGS: Cardiovascular: Ascending thoracic aorta again measures up to 4.5 cm with exact measurement limited by pulsation artifact. Normal heart size. Unchanged trace pericardial effusion. Mediastinum/Nodes: Small hiatal hernia. No enlarged lymph nodes. Thyroid is unremarkable. Lungs/Pleura: No consolidation or mass. No pleural effusion or pneumothorax. Upper Abdomen: No acute abnormality. Musculoskeletal: No acute osseous abnormality. Review of the MIP images confirms the above findings. IMPRESSION: Stable dilatation of the ascending thoracic aorta. Recommend semi-annual imaging followup by CTA or MRA. This recommendation follows 2010 ACCF/AHA/AATS/ACR/ASA/SCA/SCAI/SIR/STS/SVM Guidelines for the Diagnosis and Management of Patients With Thoracic Aortic Disease. Circulation. 2010; 121:: Z001-V494 Aortic aneurysm NOS (ICD10-I71.9) Electronically Signed   By:  PMacy MisM.D.   On: 03/04/2020 08:50   ECHOCARDIOGRAM COMPLETE  Result Date: 03/03/2020    ECHOCARDIOGRAM REPORT   Patient Name:   MKAICEE SCARPINODate of Exam: 03/03/2020 Medical Rec #:  [id number]    Height:       64.5 in Accession #:    [id number]   Weight:       177.0 lb Date of Birth:  [birth year]    BSA:          1.867 m Patient Age:    89years      BP:           170/92 mmHg Patient Gender: F             HR:           68 bpm. [place] Echo, 3D Echo, Cardiac Doppler, Color Doppler and Strain Analysis Indications:    I35 Aortic stenosis.  History:        Patient has prior history of Echocardiogram examinations, most                 recent 02/28/2019. Mitral Valve Prolapse, Signs/Symptoms:Murmur;                 Risk Factors:Sleep Apnea.  Sonographer:    KJessee Avers RDCS Referring Phys: 4[place] 1. Left ventricular ejection fraction, by estimation, is 60 to 65%. The left ventricle has normal function. The left ventricle has no regional wall motion abnormalities. The left ventricular internal cavity size was mildly dilated. Left ventricular diastolic parameters are consistent with Grade I diastolic dysfunction (impaired relaxation).  2. Right ventricular systolic function is normal. The right ventricular size is normal.  3. The mitral valve is normal in structure. Trivial mitral valve regurgitation. No evidence of mitral stenosis.  4. The aortic valve has an indeterminant number of cusps. Aortic valve regurgitation is severe. No aortic stenosis is present.  5. Aortic dilatation noted. There is moderate dilatation of the ascending aorta, measuring 46 mm.  6. The inferior vena cava is normal in size with greater than 50% respiratory variability, suggesting right atrial pressure of 3 mmHg. Comparison(s): 02/28/19 EF 60-65%. Mild AS 14 mmHg mean PG, 20 mmHg peak PG. Ascending aortic dimension 46 mm. FINDINGS  Left Ventricle: Left ventricular ejection  fraction, by estimation, is 60 to 65%. The left ventricle has normal function. The left ventricle has no regional wall motion abnormalities. The left ventricular  internal cavity size was mildly dilated. There is  no left ventricular hypertrophy. Left ventricular diastolic parameters are consistent with Grade I diastolic dysfunction (impaired relaxation). Right Ventricle: The right ventricular size is normal. No increase in right ventricular wall thickness. Right ventricular systolic function is normal. Left Atrium: Left atrial size was normal in size. Right Atrium: Right atrial size was normal in size. Pericardium: There is no evidence of pericardial effusion. Mitral Valve: The mitral valve is normal in structure. Trivial mitral valve regurgitation. No evidence of mitral valve stenosis. Tricuspid Valve: The tricuspid valve is normal in structure. Tricuspid valve regurgitation is trivial. No evidence of tricuspid stenosis. Aortic Valve: The aortic valve has an indeterminant number of cusps. Aortic valve regurgitation is severe. Aortic regurgitation PHT measures 301 msec. No aortic stenosis is present. Aortic valve mean gradient measures 9.0 mmHg. Aortic valve peak gradient  measures 15.2 mmHg. Aortic valve area, by VTI measures 2.62 cm. Pulmonic Valve: The pulmonic valve was not well visualized. Pulmonic valve regurgitation is not visualized. No evidence of pulmonic stenosis. Aorta: Aortic dilatation noted. There is moderate dilatation of the ascending aorta, measuring 46 mm. Venous: The inferior vena cava is normal in size with greater than 50% respiratory variability, suggesting right atrial pressure of 3 mmHg. IAS/Shunts: No atrial level shunt detected by color flow Doppler.  LEFT VENTRICLE PLAX 2D LVIDd:         5.50 cm  Diastology LVIDs:         3.60 cm  LV e' medial:    6.81 cm/s LV PW:         0.90 cm  LV E/e' medial:  15.9 LV IVS:        0.80 cm  LV e' lateral:   7.37 cm/s LVOT diam:     2.20 cm  LV E/e'  lateral: 14.7 LV SV:         121 LV SV Index:   65       2D Longitudinal Strain LVOT Area:     3.80 cm 2D Strain GLS (A2C):   -27.7 %                         2D Strain GLS (A3C):   -20.1 %                         2D Strain GLS (A4C):   -27.2 %                         2D Strain GLS Avg:     -25.0 %                          3D Volume EF:                         3D EF:        57 %                         LV EDV:       140 ml                         LV ESV:       61 ml  LV SV:        80 ml RIGHT VENTRICLE RV Basal diam:  2.20 cm RV S prime:     11.20 cm/s TAPSE (M-mode): 1.8 cm LEFT ATRIUM             Index       RIGHT ATRIUM           Index LA diam:        4.30 cm 2.30 cm/m  RA Pressure: 3.00 mmHg LA Vol (A2C):   44.5 ml 23.83 ml/m RA Area:     9.45 cm LA Vol (A4C):   45.6 ml 24.42 ml/m RA Volume:   16.80 ml  9.00 ml/m LA Biplane Vol: 45.2 ml 24.21 ml/m  AORTIC VALVE AV Area (Vmax):    2.57 cm AV Area (Vmean):   2.56 cm AV Area (VTI):     2.62 cm AV Vmax:           195.00 cm/s AV Vmean:          141.000 cm/s AV VTI:            0.460 m AV Peak Grad:      15.2 mmHg AV Mean Grad:      9.0 mmHg LVOT Vmax:         132.00 cm/s LVOT Vmean:        95.000 cm/s LVOT VTI:          0.317 m LVOT/AV VTI ratio: 0.69 AI PHT:            301 msec  AORTA Ao Root diam: 4.00 cm Ao Asc diam:  4.60 cm MITRAL VALVE                TRICUSPID VALVE                             Estimated RAP:  3.00 mmHg  MV E velocity: 108.00 cm/s  SHUNTS MV A velocity: 118.00 cm/s  Systemic VTI:  0.32 m MV E/A ratio:  0.92         Systemic Diam: 2.20 cm Kirk Ruths MD Electronically signed by Kirk Ruths MD Signature Date/Time: 03/03/2020/1:24:22 PM    Final      Additional studies/ records that were reviewed today include:  I reviewed the  Remote nuclear perfusion study, carotid duplex study, and the patient's last 2-D echo Doppler study from 2013.  I also reviewed her prior sleep evaluations and last CPAP titration  trial of 2011.  I reviewed the records from Dr. Cyndia Bent , as well as her CT angiogram,  echo Doppler data. A new Epworth Sleepiness Scale score was calculated in the office today   ECHO 02/28/2020 IMPRESSIONS  1. Left ventricular ejection fraction, by visual estimation, is 60 to  65%. The left ventricle has normal function. There is no left ventricular  hypertrophy.  2. Left ventricular diastolic parameters are consistent with Grade I  diastolic dysfunction (impaired relaxation).  3. The left ventricle has no regional wall motion abnormalities.  4. Global right ventricle has normal systolic function.The right  ventricular size is normal. No increase in right ventricular wall  thickness.  5. Left atrial size was normal.  6. Right atrial size was normal.  7. Trivial pericardial effusion is present.  8. The mitral valve is normal in structure. Trivial mitral valve  regurgitation. No evidence of mitral stenosis.  9. The tricuspid valve is normal in  structure.  10. Aortic valve regurgitation is moderate.  11. The aortic valve is normal in structure. Aortic valve regurgitation is  moderate. Mild aortic valve stenosis.  12. The pulmonic valve was normal in structure. Pulmonic valve  regurgitation is trivial.  13. Aortic dilatation noted.  14. 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  15. Normal pulmonary artery systolic pressure.  16. The inferior vena cava is normal in size with greater than 50%  respiratory variability, suggesting right atrial pressure of 3 mmHg.   ASSESSMENT:    No diagnosis found.  PLAN:   Tiaria Biby is a  55 year-old female who has a history of mitral valve prolapse and previously noted at least moderate aortic insufficiency.  She also has a history of sleep apnea and had been on CPAP therapy remotely and when I initially saw her she had not used CPAP in many years  When I initially saw her, she had a murmur of at least moderate aortic  insufficiency as well as MR from mitral valve prolapse.  A 5-year follow-up echo Doppler study in 2018  confirmed aortic valve thickening with a mean gradient of 8, peak gradient of 14, and at least mild-to-moderate aortic insufficiency.  There was mild prolapse of anterior mitral valve leaflet with no stenosis and only trivial MR.  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  Subsequent CT angiography suggested this to be 4.7 cm involving the ascending thoracic aorta.  She underwent evaluation by Dr. Cyndia Bent and continues to see him for follow-up evaluations with his last visit in January 2022.  Her recent echo Doppler study from March 03, 2020 was reviewed with her in detail which showed progressive aortic regurgitation now felt to be severe.  EF was 60 to 65%.  Left ventricular end-diastolic dimension was 5.5 cm and end-systolic dimension 3.6 cm.  Her ascending aorta measured 4.6 cm.  Is now recommended that she undergo follow-up echo Doppler study in 6 months for reassessment of her aortic insufficiency and chamber dimensions.  Her blood pressure today is stable on metoprolol succinate 50 mg daily irbesartan 300 mg.  I reviewed her download shows reduced compliance and significantly reduced sleep duration at only 4 hours and 2 minutes.  A lot of her problem is related to her alternating day and night shifting.  Her current BiPAP is set at a minimum EPAP pressure of 14 and maximum IPAP pressure of 25 and her 95th percentile pressure is 20/14.  She continues to have significant struct of apneic events as well as central events with AHI of 25.  Her normal LV function, and her significantly increased central events on BiPAP I have recommended she undergo an ASV titration study.  I will see her in follow-up of her study for reassessment.  I will schedule her for follow-up echo Doppler study to be done in May/June 2022.    Medication Adjustments/Labs and Tests Ordered: Current medicines are reviewed at length  with the patient today.  Concerns regarding medicines are outlined above.  Medication changes, Labs and Tests ordered today are listed in the Patient Instructions below. There are no Patient Instructions on file for this visit.   Signed, Shelva Majestic, MD  03/23/2020 3:00 PM    [facility] [address] [place], [place], [place]  [address] Phone: [phone]

## 2020-03-23 NOTE — Patient Instructions (Signed)
Medication Instructions:  The current medical regimen is effective;  continue present plan and medications.  *If you need a refill on your cardiac medications before your next appointment, please call your pharmacy*  Testing/Procedures: Echocardiogram - Your physician has requested that you have an echocardiogram. Echocardiography is a painless test that uses sound waves to create images of your heart. It provides your doctor with information about the size and shape of your heart and how well your heart's chambers and valves are working. This procedure takes approximately one hour. There are no restrictions for this procedure. This will be performed at our [facility] location - [address].    Follow-Up: At [facility], you and your health needs are our priority.  As part of our continuing mission to provide you with exceptional heart care, we have created designated Provider Care Teams.  These Care Teams include your primary Cardiologist (physician) and Advanced Practice Providers (APPs -  Physician Assistants and Nurse Practitioners) who all work together to provide you with the care you need, when you need it.  We recommend signing up for the patient portal called "MyChart".  Sign up information is provided on this After Visit Summary.  MyChart is used to connect with patients for Virtual Visits (Telemedicine).  Patients are able to view lab/test results, encounter notes, upcoming appointments, etc.  Non-urgent messages can be sent to your provider as well.   To learn more about what you can do with MyChart, go to [email or web address].    Your next appointment:   4 month(s)  The format for your next appointment:   In Person  Provider:   Nicki Guadalajara, MD   Other Instructions ASV titration needed- we will be in contact about having this completed.

## 2020-03-24 ENCOUNTER — Encounter: Payer: Self-pay | Admitting: Cardiovascular Disease

## 2020-03-26 ENCOUNTER — Telehealth: Payer: Self-pay | Admitting: *Deleted

## 2020-03-26 ENCOUNTER — Other Ambulatory Visit: Payer: Self-pay | Admitting: Cardiovascular Disease

## 2020-03-26 DIAGNOSIS — G4733 Obstructive sleep apnea (adult) (pediatric): Secondary | ICD-10-CM

## 2020-03-26 NOTE — Telephone Encounter (Signed)
Left sleep study appointment details on VM. Ok Per [employer].

## 2020-03-27 MED FILL — METOPROLOL SUCCINATE ER 25: 25 | 90 days supply | Qty: 90 | Fill #1

## 2020-03-27 MED FILL — FLUoxetine HCL 40 MG CAPS: 40 | 90 days supply | Qty: 90 | Fill #1

## 2020-04-13 ENCOUNTER — Other Ambulatory Visit (HOSPITAL_COMMUNITY): Payer: 59

## 2020-05-05 ENCOUNTER — Other Ambulatory Visit: Payer: Self-pay

## 2020-05-05 ENCOUNTER — Ambulatory Visit (HOSPITAL_BASED_OUTPATIENT_CLINIC_OR_DEPARTMENT_OTHER): Payer: 59 | Attending: Cardiovascular Disease | Admitting: Cardiovascular Disease

## 2020-05-05 DIAGNOSIS — I493 Ventricular premature depolarization: Secondary | ICD-10-CM | POA: Insufficient documentation

## 2020-05-05 DIAGNOSIS — Z79899 Other long term (current) drug therapy: Secondary | ICD-10-CM | POA: Insufficient documentation

## 2020-05-05 DIAGNOSIS — R0683 Snoring: Secondary | ICD-10-CM | POA: Diagnosis not present

## 2020-05-05 DIAGNOSIS — G4731 Primary central sleep apnea: Secondary | ICD-10-CM

## 2020-05-05 DIAGNOSIS — G4733 Obstructive sleep apnea (adult) (pediatric): Secondary | ICD-10-CM | POA: Diagnosis not present

## 2020-05-23 ENCOUNTER — Encounter (HOSPITAL_BASED_OUTPATIENT_CLINIC_OR_DEPARTMENT_OTHER): Payer: Self-pay | Admitting: Cardiovascular Disease

## 2020-05-23 NOTE — Procedures (Signed)
Patient Name: Bethany Baker, Bethany Baker Date: 05/05/2020 Gender: Female D.O.B: [birth year] Age (years): 73 Referring Provider: Nicki Guadalajara MD, ABSM Height (inches): 65 Interpreting Physician: Nicki Guadalajara MD, ABSM Weight (lbs): 170 RPSGT: Shelah Lewandowsky BMI: 29 MRN: [id number] Neck Size: 14.00  CLINICAL INFORMATION The patient is referred for a adaptive servo-ventilator titration study. Most recent polysomnogram dated 06/10/2019 revealed an AHI of 14.4/h and RDI of 21.4/h. Most recent titration study dated 06/10/2019 revealed an AHI of 6.4/h. On BiPAP she has continued to have significant obstructive apnea and central apnea events.   MEDICATIONS albuterol (PROVENTIL HFA;VENTOLIN HFA) 108 (90 Base) MCG/ACT inhaler ALPRAZolam (XANAX) 0.5 MG tablet amphetamine-dextroamphetamine (ADDERALL) 15 MG tablet esomeprazole (NEXIUM) 20 MG capsule FLUoxetine (PROZAC) 40 MG capsule irbesartan (AVAPRO) 300 MG tablet ketorolac (TORADOL) 10 MG tablet levothyroxine (SYNTHROID, LEVOTHROID) 50 MCG tablet lidocaine (LIDODERM) 5 % liothyronine (CYTOMEL) 5 MCG tablet loratadine (CLARITIN) 10 MG tablet methocarbamol (ROBAXIN) 500 MG tablet metoprolol succinate (TOPROL-XL) 50 MG 24 hr tablet montelukast (SINGULAIR) 10 MG tablet Multiple Vitamin (MULTIVITAMIN) tablet traMADol (ULTRAM) 50 MG tablet Medications self-administered by patient taken the night of the study : NEXIUM  SLEEP STUDY TECHNIQUE As per the AASM Manual for the Scoring of Sleep and Associated Events v2.3 (April 2016) with a hypopnea requiring 4% desaturations.  The channels recorded and monitored were frontal, central and occipital EEG, electrooculogram (EOG), submentalis EMG (chin), nasal and oral airflow, thoracic and abdominal wall motion, anterior tibialis EMG, snore microphone, electrocardiogram, and pulse oximetry.  RESPIRATORY PARAMETERS Optimal Min IPAP (cm): 5 Optimal Max IPAP (cm): 8 Optimal Min EPAP (cm): 5 Optimal Max  EPAP (cm): 8 Optimal Max Pressure (cm): 15 Optimal Min PS (cm): 3 Optimal Max PS (cm): 15 Opitmal Breathing Rate (/min): Auto Overall Min O2 (%): 89.0 Min O2 at Optimal Pressure (%): 89.0 AHI at Optimal (/hr): N/A   SLEEP ARCHITECTURE During a recording time of 405.4 minutes, the patient slept for 275 minutes. Sleep efficiency was 67.8%%. The patient spent 9.6%% of the night in stage N1 sleep, 71.1%% in stage N2 sleep, 0.0%% in stage N3 and 19.3% in REM. Wake after sleep onset (WASO) was 89.7 minutes. Alpha intrusion was PRESENT ABSENT. Supine sleep was 3.99%. The arousal index was 10.0.  LEG MOVEMENT DATA PLM Index (/hr): 3.5 PLM Arousal Index (/hr): 0.0  CARDIAC DATA The 2 lead EKG demonstrated sinus rhythm. The mean heart rate was 60.5 beats per minute. Other EKG findings include: PACs.  IMPRESSIONS - No significant obstructive sleep apnea occurred during this study (AHI 0.9/hour). An optimal ASV pressure was selected. - Mild oxygen desaturation was noted during this study (Min O2 = 89.0). - The patient snored for % of sleep with moderate snoring volume. - EKG findings include PVCs. - Clinically significant periodic limb movements did not occur during sleep.  DIAGNOSIS - Obstructive Sleep Apnea (G47.33)  RECOMMENDATIONS - Trial of SV Advance EPAP Min 5 and Max 8 cmH2O, Pressure Support Min 3 and Max 15 cmH2O and Breath Rate of Auto BrPM - Effort should be made to optimize nasal and oropharyngeal patency. - Avoid alcohol, sedatives and other CNS depressants that may worsen sleep apnea and disrupt normal sleep architecture. - Sleep hygiene should be reviewed to assess factors that may improve sleep quality. - Weight management and regular exercise should be initiated or continued. - Recommend a downoad in 30 days and sleep clinic evaluation after 4 weeks of therapy  [Electronically signed] 05/23/2020 02:35 PM  Nicki Guadalajara MD, [facility],  ABSM Diplomate, American Board of Sleep  Medicine   NPI: [id number]  [facility] PH: [phone]   FX: [phone] ACCREDITED BY THE AMERICAN ACADEMY OF SLEEP MEDICINE

## 2020-05-25 ENCOUNTER — Telehealth: Payer: Self-pay | Admitting: *Deleted

## 2020-05-25 NOTE — Telephone Encounter (Signed)
Left message her sleep study has been completed. Order for ASV machine has been sent to [facility]. Once benefit information has been received from her insurance and a machine has become available [facility] will contact her for set up.

## 2020-06-30 ENCOUNTER — Other Ambulatory Visit: Payer: Self-pay | Admitting: Cardiovascular Disease

## 2020-06-30 DIAGNOSIS — G4733 Obstructive sleep apnea (adult) (pediatric): Secondary | ICD-10-CM

## 2020-07-01 ENCOUNTER — Other Ambulatory Visit (HOSPITAL_COMMUNITY): Payer: Self-pay

## 2020-07-01 ENCOUNTER — Other Ambulatory Visit (HOSPITAL_BASED_OUTPATIENT_CLINIC_OR_DEPARTMENT_OTHER): Payer: Self-pay | Admitting: Internal Medicine

## 2020-07-01 ENCOUNTER — Other Ambulatory Visit (HOSPITAL_BASED_OUTPATIENT_CLINIC_OR_DEPARTMENT_OTHER): Payer: Self-pay

## 2020-07-01 MED FILL — Methocarbamol Tab 500 MG: ORAL | 30 days supply | Qty: 90 | Fill #0 | Status: AC

## 2020-07-01 MED FILL — Montelukast Sodium Tab 10 MG (Base Equiv): ORAL | 90 days supply | Qty: 90 | Fill #0 | Status: AC

## 2020-07-01 MED FILL — Levothyroxine Sodium Tab 50 MCG: ORAL | 86 days supply | Qty: 90 | Fill #0 | Status: AC

## 2020-07-01 MED FILL — Pantoprazole Sodium EC Tab 40 MG (Base Equiv): ORAL | 90 days supply | Qty: 90 | Fill #0 | Status: AC

## 2020-07-01 MED FILL — Liothyronine Sodium Tab 5 MCG: ORAL | 90 days supply | Qty: 180 | Fill #0 | Status: AC

## 2020-07-01 MED FILL — Albuterol Sulfate Inhal Aero 108 MCG/ACT (90MCG Base Equiv): RESPIRATORY_TRACT | 16 days supply | Qty: 18 | Fill #0 | Status: AC

## 2020-07-01 MED FILL — Ketorolac Tromethamine Tab 10 MG: ORAL | 90 days supply | Qty: 180 | Fill #0 | Status: AC

## 2020-07-02 ENCOUNTER — Other Ambulatory Visit (HOSPITAL_BASED_OUTPATIENT_CLINIC_OR_DEPARTMENT_OTHER): Payer: Self-pay

## 2020-07-03 ENCOUNTER — Other Ambulatory Visit (HOSPITAL_BASED_OUTPATIENT_CLINIC_OR_DEPARTMENT_OTHER): Payer: Self-pay

## 2020-07-06 ENCOUNTER — Telehealth: Payer: Self-pay | Admitting: *Deleted

## 2020-07-06 NOTE — Telephone Encounter (Signed)
Orders for ASV machine was sent to [facility]. This was a resend. They were previously sent on 3/38/22 but per Jasmine December she never received them. Jasmine December called me today to ask if I would reach out to the patient and have her to contact them. They have made several to contact her with no response. I sent the patient a email asking her to contact [facility].

## 2020-07-07 ENCOUNTER — Other Ambulatory Visit (HOSPITAL_BASED_OUTPATIENT_CLINIC_OR_DEPARTMENT_OTHER): Payer: Self-pay | Admitting: Internal Medicine

## 2020-07-07 ENCOUNTER — Other Ambulatory Visit (HOSPITAL_BASED_OUTPATIENT_CLINIC_OR_DEPARTMENT_OTHER): Payer: Self-pay

## 2020-07-08 ENCOUNTER — Other Ambulatory Visit (HOSPITAL_BASED_OUTPATIENT_CLINIC_OR_DEPARTMENT_OTHER): Payer: Self-pay

## 2020-07-08 DIAGNOSIS — G4731 Primary central sleep apnea: Secondary | ICD-10-CM | POA: Diagnosis not present

## 2020-07-08 MED ORDER — TRAMADOL HCL 50 MG PO TABS
ORAL_TABLET | Freq: Every day | ORAL | 1 refills | Status: AC | PRN
  Filled 2020-07-08: qty 90, 90d supply, fill #0

## 2020-07-09 ENCOUNTER — Other Ambulatory Visit (HOSPITAL_BASED_OUTPATIENT_CLINIC_OR_DEPARTMENT_OTHER): Payer: Self-pay

## 2020-07-10 ENCOUNTER — Other Ambulatory Visit (HOSPITAL_COMMUNITY): Payer: 59

## 2020-07-13 ENCOUNTER — Other Ambulatory Visit (HOSPITAL_BASED_OUTPATIENT_CLINIC_OR_DEPARTMENT_OTHER): Payer: Self-pay

## 2020-07-13 ENCOUNTER — Ambulatory Visit: Payer: 59 | Admitting: Cardiovascular Disease

## 2020-07-13 MED ORDER — FLUOXETINE HCL 40 MG PO CAPS
ORAL_CAPSULE | ORAL | 1 refills | Status: DC
  Filled 2020-07-13 – 2020-07-14 (×2): qty 90, 90d supply, fill #0
  Filled 2020-12-13: qty 90, 90d supply, fill #1

## 2020-07-14 ENCOUNTER — Other Ambulatory Visit (HOSPITAL_BASED_OUTPATIENT_CLINIC_OR_DEPARTMENT_OTHER): Payer: Self-pay

## 2020-08-08 DIAGNOSIS — G4731 Primary central sleep apnea: Secondary | ICD-10-CM | POA: Diagnosis not present

## 2020-08-14 ENCOUNTER — Encounter: Payer: Self-pay | Admitting: Cardiovascular Disease

## 2020-08-14 ENCOUNTER — Ambulatory Visit: Payer: 59 | Admitting: Cardiovascular Disease

## 2020-08-14 ENCOUNTER — Other Ambulatory Visit: Payer: Self-pay

## 2020-08-14 ENCOUNTER — Other Ambulatory Visit (HOSPITAL_BASED_OUTPATIENT_CLINIC_OR_DEPARTMENT_OTHER): Payer: Self-pay

## 2020-08-14 DIAGNOSIS — Z8739 Personal history of other diseases of the musculoskeletal system and connective tissue: Secondary | ICD-10-CM

## 2020-08-14 DIAGNOSIS — I350 Nonrheumatic aortic (valve) stenosis: Secondary | ICD-10-CM | POA: Diagnosis not present

## 2020-08-14 DIAGNOSIS — I77819 Aortic ectasia, unspecified site: Secondary | ICD-10-CM | POA: Diagnosis not present

## 2020-08-14 DIAGNOSIS — I351 Nonrheumatic aortic (valve) insufficiency: Secondary | ICD-10-CM | POA: Diagnosis not present

## 2020-08-14 DIAGNOSIS — Z87898 Personal history of other specified conditions: Secondary | ICD-10-CM | POA: Diagnosis not present

## 2020-08-14 DIAGNOSIS — I10 Essential (primary) hypertension: Secondary | ICD-10-CM | POA: Diagnosis not present

## 2020-08-14 DIAGNOSIS — I341 Nonrheumatic mitral (valve) prolapse: Secondary | ICD-10-CM | POA: Diagnosis not present

## 2020-08-14 DIAGNOSIS — G4731 Primary central sleep apnea: Secondary | ICD-10-CM

## 2020-08-14 MED ORDER — METOPROLOL SUCCINATE ER 50 MG PO TB24
50.0000 mg | ORAL_TABLET | Freq: Every day | ORAL | 3 refills | Status: AC
  Filled 2020-08-14: qty 90, 90d supply, fill #0

## 2020-08-14 NOTE — Patient Instructions (Signed)
Medication Instructions:  Continue current medications  *If you need a refill on your cardiac medications before your next appointment, please call your pharmacy*   Lab Work: None Ordered   Testing/Procedures: None Ordered   Follow-Up: At [facility], you and your health needs are our priority.  As part of our continuing mission to provide you with exceptional heart care, we have created designated Provider Care Teams.  These Care Teams include your primary Cardiologist (physician) and Advanced Practice Providers (APPs -  Physician Assistants and Nurse Practitioners) who all work together to provide you with the care you need, when you need it.  We recommend signing up for the patient portal called "MyChart".  Sign up information is provided on this After Visit Summary.  MyChart is used to connect with patients for Virtual Visits (Telemedicine).  Patients are able to view lab/test results, encounter notes, upcoming appointments, etc.  Non-urgent messages can be sent to your provider as well.   To learn more about what you can do with MyChart, go to [email or web address].    Your next appointment:   6 month(s)  The format for your next appointment:   In Person  Provider:   You may see Allysa Governale Kelly, MD or one of the following Advanced Practice Providers on your designated Care Team:    Hao Meng, PA-C  Angela Duke, PA-C or   Krista Kroeger, PA-C     

## 2020-08-15 ENCOUNTER — Encounter: Payer: Self-pay | Admitting: Cardiovascular Disease

## 2020-08-15 NOTE — Progress Notes (Signed)
Cardiology Office Note    Date:  08/15/2020   ID:  Bethany Baker, DOB February 14, 1966, MRN [id number]  PCP:  Jani Gravel, MD  Cardiologist:  Shelva Majestic, MD   5 month F/U cardiology/sleep evaluation  History of Present Illness:  Bethany Baker is a 55 y.o. female who was referred through the courtesy of Dr. Jani Gravel for evaluation of aortic insufficiency. I last saw her in January 2022.  She presents for follow-up evaluation.  Bethany Baker, and in the past was felt to possibly have a bicuspid aortic valve.  In January 2001, she underwent a nuclear perfusion study after she developed complaints of chest pain radiating to her left arm with mild shortness of breath.  This revealed normal perfusion without scar or ischemia.  An ejection fraction of 64%.  She had remotely seen Dr. Tami Ribas.  She underwent a carotid duplex study in March 2005 which was mildly abnormal.  At the origin of the left carotid and left subclavian.  The aorta measured 4 cm.  There was no significant diameter reduction in the carotid arteries.  She had undergone an echo Doppler study in June 2013 which showed an EF of 60-65% with grade 1 diastolic dysfunction.  There was moderate central aortic insufficiency with a posteriorly directed jet that impaired excursion of the anterior mitral valve leaflet.  Her mitral valve had a parachute appearance with late systolic prolapse in the anterior mitral leaflet reduced leaflet excursion.  The peak gradient was 10 mm.  She has not had recent follow-up of her valvular pathology.  She had recently seen Dr. Jani Gravel.  He did appreciated a progressive aortic insufficiency murmur.  She had noticed some shortness of breath with activity and denies palpitations.  .  When I initially saw her, she told me that she was originally diagnosed of as having obstructive sleep apnea and had undergone an initial sleep study in 2005  which showed an AHI of 11 per hour.  She apparently underwent a CPAP titration in 2011 and was titrated up to 16 cm water pressure.  She had not used CPAP in over 5 years.  She currently admits to nonrestorative sleep, fatigue, and was waking upt 2-3 times per night with nocturia.   She was unaware of any nocturnal palpitations.    She underwent an echo Doppler study in 10/14/2016 which showed normal systolic function with an EF of 55-60%.  There was grade 1 diastolic dysfunction.  There was a thickened aortic valve with mild-to-moderate aortic insufficiency, mild prolapse of anterior mitral valve leaflet with trace MR, and her ascending aorta was found to be dilated at 4.6 cm.  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  Carotid duplex imaging showed soft plaque in the right bifurcation with 1-39% stenosis.  There was no dilation at the origin of the left carotid artery and subclavian artery measuring 3.0 x 3.0 cm.  She had normal subclavian arteries bilaterally, and patent vertebral arteries with antegrade flow.  She  underwent a F/U sleep study on 11/29/2016.  Overall AHI was 2.9, but her RDI was 11.5, which was similar to her remote AHI of years ago.  During REM sleep her AHI was 12.6/h.  Although she felt that she did not sleep well that night, she had 82.7% sleep efficiency.  Wake after sleep onset was only 47.1 minutes.  REM latency was 75 minutes.   In light of her ascending aneurysm.  I recommended institution of irbesartan 150 mg daily, which be hopeful both for blood pressure control as well as her diastolic dysfunction.  I referred her to Dr. 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  At present, there is no indication for surgery at this time, but she will require close follow-up.  Her CTA of her chest.  We repeated in 6 months to establish stability and then  to decide on longer-term surveillance.  Surgery would not be recommended until the aneurysm was 5.5 cm, or there was a period of rapid enlargement of 5 mm over a year.  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  Typically she works the night shift, usually from 8 PM to 8 AM but at times has to work additional shifts commencing at 6 PM.  The days that she is not working she is switching her sleep cycle and as result, has not established a good circadian rhythm. .   When saw her in March 2021 after not having seen her over the previous 2 years she continued to be followed by her rheumatologist for mixed connective tissue disease.  She continues to work on Friday Saturday and Sunday evening shift from 8 PM to 8 AM.  Most of the time she is not working she typically is sleeping and essentially sleeps all of Monday.  She admits to loud snoring.  She has recently noticed some shortness of breath if she carries heavy boxes.  In addition she has noticed recent blood pressure elevation with blood pressures at home typically running in the 145-155 range.  I was concerned that she had obstructive sleep apnea and recommended she undergo definitive sleep study.  Bethany Baker underwent a follow-up split-night sleep study on June 10, 2019.  She was found to have mild to moderate overall sleep apnea on the diagnostic portion of the study with an AHI of 14.4 and RDI of 21.4/h; however events were moderately severe during REM sleep with an AHI of 29.3/h.  CPAP was initiated at 5 cm and was titrated to 18 cm of water pressure but AHI remained elevated at 15.6.  Due to continued events BiPAP was initiated and  was titrated up to 20/16.  It was recommended that she initiate BiPAP with a minimum EPAP pressure of 15 with pressure support of 4 and maximum IPAP pressure of 25.  Her BiPAP set up date was May 17, 2019 with [facility] as her DME company.    I saw her for  follow-up on October 28, 2019.  A download was obtained in the office from July 31 through October 27, 2019 which shows that she is meeting compliance standards with excellent usage.  Average usage is 6 hours and 33 minutes.  Her 95th percentile pressure is 19. 5/15.5.  AHI is 9.2 with an apnea index of 8.8 and central index 8. Since initiating BiPAP she is sleeping better.  Unfortunately her alteration of night and day sleep has not allowed her to get into a normal sleep pattern.  She often sleeps from noon until 6 PM.  If she is not working at night then she sleeps at night.  A new Epworth Sleepiness Scale score was calculated in the office today and this endorsed at 7 arguing against residual daytime sleepiness.  I recommended slight adjustment of her pressure support up to 5 cm in attempt to see if this improved her AHI as well as central events.  In addition her blood pressure was elevated despite being on irbesartan 300 mg daily and metoprolol succinate 25 mg.  I recommended she increase metoprolol succinate to 50 mg.  She underwent a 2D echo Doppler study on March 03, 2020 which showed normal EF at 60 to 65%.  The echo now suggested severe aortic Baker.  Was no aortic stenosis.  There was moderate dilation of the ascending aorta measuring 46 mm.  This morning, underwent a follow-up CT angio of her chest and aorta on March 04, 2020  showed stable dilation of the ascending aorta at 4.5 cm.  She had seen Dr. Cyndia Bent on March 04, 2020 who felt that she did not meet criteria for aortic valve replacement by the 2020 [facility] guideline but requires close follow-up and it was recommended that she have another echocardiogram in 6 months to reevaluate her left ventricular function and internal dimensions.  I last saw her in the office on March 23, 2020.  A new download was obtained in the office  regarding her sleep apnea.  Showed reduced compliance with only 4 hours and 2 minutes of average use and usage days  and only 12 of the last 30 days or 40%.  She continues to have issues with alternating day and night shifts as result is not sleeping well.  On the days that she works evenings from 8 PM until 8 AM she often goes home in the morning, eats, gets to bed at 1:00 in the afternoon and wakes up at 6:30.  Her most recent download with pressures with a minimum EPAP of 14 and maximum IPAP of 25 shows never get AHI at 25.0 as well as significant central index of 24.4.  During that evaluation with her significant central events on BiPAP and normal LV function I recommended that she undergo an ASV titration and recommended that her follow-up echo Doppler study be done in 6 months.  Bethany Baker underwent her ASV titration on May 05, 2020.  It was recommended that she initiate ASV with an EPAP min of 5 and max at 8 cm of water, with a pressure support min of 3 and maximum of 15 cm of water with a auto breath rate.  Her ASV set up date was Jul 08, 2020.   In the office today I obtained a download from May 18 through August 13, 2020.  Compliance is excellent but she is only sleeping about 6 hours and 28 minutes with her ASV.  AHI is excellent at 0.4.  Her 95th percentile pressure was 14.2/7.1 with a maximum average pressure of 17.6/7.6.  There is no leak.  She feels significantly improved since initiating ASV.  She is scheduled for echo Doppler study on September 09, 2020.  She presents for evaluation.      Past Medical History:  Diagnosis Date   Aortic insufficiency    Depression    Fibromyalgia    MR (mitral Baker)    MVP (mitral valve prolapse)    OSA (obstructive sleep apnea) 06/06/1446   Systolic murmur    Thyroid disease    HYPOTHYROIDISM    Past Surgical History:  Procedure Laterality Date   TRANSESOPHAGEAL ECHOCARDIOGRAM      Current Medications: Outpatient Medications Prior to Visit  Medication Sig Dispense Refill   albuterol (VENTOLIN HFA) 108 (90 Base) MCG/ACT inhaler  INHALE 2 PUFFS BY MOUTH  EVERY 4 HOURS AS NEEDED FOR SHORTNESS OF BREATH OR WHEEZING 18 g 1   ALPRAZolam (XANAX) 0.5 MG tablet Take 0.5 mg by mouth at bedtime as needed for anxiety.     amphetamine-dextroamphetamine (ADDERALL) 15 MG tablet Take by mouth daily. Pt takes 0.5 tablet     brompheniramine-pseudoephedrine-DM 30-2-10 MG/5ML syrup TAKE 5 ML BY MOUTH 3 TIMES A DAY AS NEEDED 200 mL 0   diclofenac Sodium (VOLTAREN) 1 % GEL 4 grams     esomeprazole (NEXIUM) 20 MG capsule Take 20 mg by mouth daily at 12 noon.     FLUoxetine (PROZAC) 40 MG capsule take 1 capsule by mouth every morning 90 capsule 1   hydroxychloroquine (PLAQUENIL) 200 MG tablet 2 tablets     irbesartan (AVAPRO) 300 MG tablet Take 1 tablet (300 mg total) by mouth daily. 90 tablet 3   ketorolac (TORADOL) 10 MG tablet TAKE ONE TABLET BY MOUTH TWICE DAILY AS NEEDED 180 tablet 2   levothyroxine (SYNTHROID) 50 MCG tablet TAKE ONE TABLET BY MOUTH ON AN EMPTY STOMACH DAILY IN THE MORNING AND AN EXTRA 1/2 TABLET ON SUNDAYS 90 tablet 1   lidocaine (LIDODERM) 5 % 1 patch to intact skin remove after 12 hours     liothyronine (CYTOMEL) 5 MCG tablet TAKE ONE TABLET BY MOUTH TWICE DAILY ON AN EMPTY STOMACH 180 tablet 1   loratadine (CLARITIN) 10 MG tablet Take 10 mg by mouth daily.     Loratadine 10 MG CAPS 1 capsule     methocarbamol (ROBAXIN) 500 MG tablet TAKE ONE TABLET BY MOUTH EVERY 8 HOURS 90 tablet 1   montelukast (SINGULAIR) 10 MG tablet TAKE 1 TABLET BY MOUTH EVERY EVENING 90 tablet 1   montelukast (SINGULAIR) 10 MG tablet 1 tablet in the evening     Multiple Vitamin (MULTIVITAMIN) tablet Take 1 tablet by mouth daily.     pantoprazole (PROTONIX) 40 MG tablet TAKE ONE TABLET BY MOUTH ONCE DAILY 90 tablet 3   traMADol (ULTRAM) 50 MG tablet TAKE 1 TABLET BY MOUTH ONCE DAILY AS NEEDED 90 tablet 1   albuterol (PROVENTIL HFA;VENTOLIN HFA) 108 (90 Base) MCG/ACT inhaler Inhale 2 puffs into the lungs every 6 (six) hours as needed for wheezing or shortness of breath.      FLUoxetine (PROZAC) 40 MG capsule   4   FLUoxetine (PROZAC) 40 MG capsule 1 capsule in the morning     FLUoxetine (PROZAC) 40 MG capsule TAKE ONE CAPSULE BY MOUTH DAILY IN THE MORNING 90 capsule 1   ketorolac (TORADOL) 10 MG tablet as needed.  0   levothyroxine (SYNTHROID, LEVOTHROID) 50 MCG tablet Take 50 mcg by mouth daily before breakfast. Takes additional 1.5 on sundays     lidocaine (LIDODERM) 5 % Place 1 patch onto the skin daily. Remove & Discard patch within 12 hours or as directed by MD     liothyronine (CYTOMEL) 5 MCG tablet Take 5 mcg by mouth daily.     liothyronine (CYTOMEL) 5 MCG tablet 1 tablet on an empty stomach     methocarbamol (ROBAXIN) 500 MG tablet Take 500 mg by mouth 4 (four) times daily.     methocarbamol (ROBAXIN) 500 MG tablet Take 1 tablet by mouth 4 (four) times daily.     metoprolol succinate (TOPROL-XL) 50 MG 24 hr tablet Take 1 tablet (50 mg total) by mouth daily. 90 tablet 3   montelukast (SINGULAIR) 10 MG tablet Take 10 mg by  mouth at bedtime.     pantoprazole (PROTONIX) 40 MG tablet 1 tablet     traMADol (ULTRAM) 50 MG tablet Take 50 mg by mouth every 6 (six) hours as needed.     traMADol (ULTRAM) 50 MG tablet Take 1 tablet by mouth as needed.     metoprolol succinate (TOPROL-XL) 25 MG 24 hr tablet TAKE 1 TABLET (25 MG TOTAL) BY MOUTH DAILY. 90 tablet 3   No facility-administered medications prior to visit.     Allergies:   Acetaminophen, Adhesive [tape], Aspirin, Effexor [venlafaxine], Eggs or egg-derived products, Ibuprofen, Mercury, Wellbutrin [bupropion], and Atenolol   Social History   Socioeconomic History   Marital status: Single    Spouse name: Not on file   Number of children: Not on file   Years of education: Not on file   Highest education level: Not on file  Occupational History   Not on file  Tobacco Use   Smoking status: Never   Smokeless tobacco: Never  Substance and Sexual Activity   Alcohol use: No   Drug use: No   Sexual  activity: Not on file  Other Topics Concern   Not on file  Social History Narrative   Not on file   Social Determinants of Health   Financial Resource Strain: Not on file  Food Insecurity: Not on file  Transportation Needs: Not on file  Physical Activity: Not on file  Stress: Not on file  Social Connections: Not on file     Family History:  The patient's family history includes Cancer in her mother; Fibromyalgia in her mother; Heart failure in her father; Hypertension in her brother and father; Mitral valve prolapse in her mother.   ROS General: Negative; No fevers, chills, or night sweats;  HEENT: Negative; No changes in vision or hearing, sinus congestion, difficulty swallowing Pulmonary: Negative; No cough, wheezing, shortness of breath, hemoptysis Cardiovascular: see HPI GI: Negative; No nausea, vomiting, diarrhea, or abdominal pain GU: Negative; No dysuria, hematuria, or difficulty voiding Musculoskeletal: Occasional leg cramps c Hematologic/Oncology: Negative; no easy bruising, bleeding Endocrine: Negative; no heat/cold intolerance; no diabetes Neuro: Negative; no changes in balance, headaches Skin: Negative; No rashes or skin lesions Psychiatric: Negative; No behavioral problems, depression Sleep: Positive for  sleep apnea with continued central events on BiPAP, necessitating ASV titration; , fatigue, nonrestorative sleep, nocturia and snoring; no bruxism, restless legs, hypnogognic hallucinations, no cataplexy Other comprehensive 14 point system review is negative.   PHYSICAL EXAM:   VS:  BP 132/80 (BP Location: Left Arm)   Pulse 79   Ht 5' 4.5" (1.638 m)   Wt 175 lb 3.2 oz (79.5 kg)   SpO2 98%   BMI 29.61 kg/m     Repeat blood pressure by me was 140/80 and 145/82.  Wt Readings from Last 3 Encounters:  08/14/20 175 lb 3.2 oz (79.5 kg)  05/05/20 170 lb (77.1 kg)  03/23/20 173 lb (78.5 kg)   General: Alert, oriented, no distress.  Skin: normal turgor, no  rashes, warm and dry HEENT: Normocephalic, atraumatic. Pupils equal round and reactive to light; sclera anicteric; extraocular muscles intact; Nose without nasal septal hypertrophy Mouth/Parynx benign; Mallinpatti scale 3 Neck: No JVD, no carotid bruits; normal carotid upstroke Lungs: clear to ausculatation and percussion; no wheezing or rales Chest wall: without tenderness to palpitation Heart: PMI not displaced, RRR, s1 s2 normal, 2/6 systolic murmur, 2/6 aortic valve diastolic murmur, no rubs, gallops, thrills, or heaves Abdomen: soft, nontender; no hepatosplenomehaly, BS+; abdominal  aorta nontender and not dilated by palpation. Back: no CVA tenderness Pulses 2+ Musculoskeletal: full range of motion, normal strength, no joint deformities Extremities: no clubbing cyanosis or edema, Homan's sign negative  Neurologic: grossly nonfocal; Cranial nerves grossly wnl Psychologic: Normal mood and affect    Studies/Labs Reviewed:   EKG:  EKG is ordered today. ECG (independently read by me): Normal sinus rhythm at 63 bpm, LVH by voltage.  Nonspecific T wave abnormality.  Normal intervals, no ectopy.  August 2021 ECG (independently read by me): Normal sinus rhythm 86 bpm, probable left atrial enlargement.  LVH by voltage.  Nonspecific ST changes.  QTc interval 466 ms.  March 2021ECG (independently read by me): Normal sinus rhythm at 87 bpm, probable left atrial enlargement, borderline LVH by voltage.  Nonspecific T wave abnormality.  QTc interval 454 ms  January 2019 ECG (independently read by me): Normal sinus rhythm 84 bpm.  LVH by voltage criteria in aVL.  Nonspecific T changes.  October 2018 ECG (independently read by me): Normal sinus rhythm at 85 bpm.  Left axis deviation.  LVH by voltage criteria.  August 2018 ECG (independently read by me): Normal sinus rhythm at 85 bpm.  Borderline voltage for LVH.  T-wave abnormality in lead 3 and aVF.  Recent Labs: BMP Latest Ref Rng & Units  10/20/2008 09/07/2007 03/24/2007  Glucose 70 - 99 mg/dL 86 81 125(H)  BUN 6 - 23 mg/dL '9 10 15  ' Creatinine 0.4 - 1.2 mg/dL 0.70 0.84 0.9  Sodium 135 - 145 mEq/L 138 139 140  Potassium 3.5 - 5.1 mEq/L 3.5 3.7 3.7  Chloride 96 - 112 mEq/L 103 103 106  CO2 19 - 32 mEq/L 29 23 -  Calcium 8.4 - 10.5 mg/dL 8.7 8.9 -     Hepatic Function Latest Ref Rng & Units 10/20/2008 09/07/2007  Total Protein 6.0 - 8.3 g/dL 6.8 6.9  Albumin 3.5 - 5.2 g/dL 3.9 4.5  AST 0 - 37 U/L 31 17  ALT 0 - 35 U/L 25 12  Alk Phosphatase 39 - 117 U/L 74 61  Total Bilirubin 0.3 - 1.2 mg/dL 0.6 0.5  Bilirubin, Direct 0.0 - 0.3 mg/dL - 0.1    CBC Latest Ref Rng & Units 10/20/2008 12/17/2007 03/24/2007  WBC 4.0 - 10.5 K/uL 5.7 5.3 -  Hemoglobin 12.0 - 15.0 g/dL 12.0 12.4 14.3  Hematocrit 36.0 - 46.0 % 36.1 35.9(L) 42.0  Platelets 150 - 400 K/uL 227 220 -   Lab Results  Component Value Date   MCV 89.8 10/20/2008   MCV 99.6 12/17/2007   Lab Results  Component Value Date   TSH 2.956 09/07/2007   No results found for: HGBA1C   BNP No results found for: BNP  ProBNP No results found for: PROBNP   Lipid Panel     Component Value Date/Time   CHOL 186 09/07/2007 2130   TRIG 80 09/07/2007 2130   HDL 72 09/07/2007 2130   CHOLHDL 2.6 Ratio 09/07/2007 2130   VLDL 16 09/07/2007 2130   LDLCALC 98 09/07/2007 2130     RADIOLOGY: No results found.    Additional studies/ records that were reviewed today include:  I reviewed the  Remote nuclear perfusion study, carotid duplex study, and the patient's last 2-D echo Doppler study from 2013.  I also reviewed her prior sleep evaluations and last CPAP titration trial of 2011.  I reviewed the records from Dr. Cyndia Bent , as well as her CT angiogram,  echo Doppler data. A  new Epworth Sleepiness Scale score was calculated in the office today   ECHO 02/28/2019 IMPRESSIONS   1. Left ventricular ejection fraction, by visual estimation, is 60 to  65%. The left ventricle has  normal function. There is no left ventricular  hypertrophy.   2. Left ventricular diastolic parameters are consistent with Grade I  diastolic dysfunction (impaired relaxation).   3. The left ventricle has no regional wall motion abnormalities.   4. Global right ventricle has normal systolic function.The right  ventricular size is normal. No increase in right ventricular wall  thickness.   5. Left atrial size was normal.   6. Right atrial size was normal.   7. Trivial pericardial effusion is present.   8. The mitral valve is normal in structure. Trivial mitral valve  Baker. No evidence of mitral stenosis.   9. The tricuspid valve is normal in structure.  10. Aortic valve Baker is moderate.  11. The aortic valve is normal in structure. Aortic valve Baker is  moderate. Mild aortic valve stenosis.  12. The pulmonic valve was normal in structure. Pulmonic valve  Baker is trivial.  13. Aortic dilatation noted.  14. 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  15. Normal pulmonary artery systolic pressure.  16. The inferior vena cava is normal in size with greater than 50%  respiratory variability, suggesting right atrial pressure of 3 mmHg.   ECHO: 03/03/2020 IMPRESSIONS   1. Left ventricular ejection fraction, by estimation, is 60 to 65%. The  left ventricle has normal function. The left ventricle has no regional  wall motion abnormalities. The left ventricular internal cavity size was  mildly dilated. Left ventricular  diastolic parameters are consistent with Grade I diastolic dysfunction  (impaired relaxation).   2. Right ventricular systolic function is normal. The right ventricular  size is normal.   3. The mitral valve is normal in structure. Trivial mitral valve  Baker. No evidence of mitral stenosis.   4. The aortic valve has an indeterminant number of cusps. Aortic valve  Baker is severe. No aortic stenosis is  present.   5. Aortic dilatation noted. There is moderate dilatation of the ascending  aorta, measuring 46 mm.   6. The inferior vena cava is normal in size with greater than 50%  respiratory variability, suggesting right atrial pressure of 3 mmHg.   Comparison(s): 02/28/19 EF 60-65%. Mild AS 14 mmHg mean PG, 20 mmHg peak  PG. Ascending aortic dimension 46 mm.    ASV TITRATION: 05/05/2020 CLINICAL INFORMATION The patient is referred for a adaptive servo-ventilator titration study. Most recent polysomnogram dated 06/10/2019 revealed an AHI of 14.4/h and RDI of 21.4/h. Most recent titration study dated 06/10/2019 revealed an AHI of 6.4/h. On BiPAP she has continued to have significant obstructive apnea and central apnea events.   MEDICATIONS albuterol (PROVENTIL HFA;VENTOLIN HFA) 108 (90 Base) MCG/ACT inhaler ALPRAZolam (XANAX) 0.5 MG tablet amphetamine-dextroamphetamine (ADDERALL) 15 MG tablet esomeprazole (NEXIUM) 20 MG capsule FLUoxetine (PROZAC) 40 MG capsule irbesartan (AVAPRO) 300 MG tablet ketorolac (TORADOL) 10 MG tablet levothyroxine (SYNTHROID, LEVOTHROID) 50 MCG tablet lidocaine (LIDODERM) 5 % liothyronine (CYTOMEL) 5 MCG tablet loratadine (CLARITIN) 10 MG tablet methocarbamol (ROBAXIN) 500 MG tablet metoprolol succinate (TOPROL-XL) 50 MG 24 hr tablet montelukast (SINGULAIR) 10 MG tablet Multiple Vitamin (MULTIVITAMIN) tablet traMADol (ULTRAM) 50 MG tablet Medications self-administered by patient taken the night of the study : [place] As per the AASM Manual for the Scoring of Sleep and  Associated Events v2.3 (April 2016) with a hypopnea requiring 4% desaturations.   The channels recorded and monitored were frontal, central and occipital EEG, electrooculogram (EOG), submentalis EMG (chin), nasal and oral airflow, thoracic and abdominal wall motion, anterior tibialis EMG, snore microphone, electrocardiogram, and pulse oximetry.   RESPIRATORY  PARAMETERS Optimal Min IPAP (cm):          5          Optimal Max IPAP (cm):         8          Optimal Min EPAP (cm):    5          Optimal Max EPAP (cm):        8 Optimal Max Pressure (cm):   15        Optimal Min PS (cm):  3          Optimal Max PS (cm): 15            Opitmal Breathing Rate (/min):           Auto Overall Min O2 (%):    89.0     Min O2 at Optimal Pressure (%):       89.0     AHI at Optimal (/hr):    N/A         SLEEP ARCHITECTURE During a recording time of 405.4 minutes, the patient slept for 275 minutes. Sleep efficiency was 67.8%%. The patient spent 9.6%% of the night in stage N1 sleep, 71.1%% in stage N2 sleep, 0.0%% in stage N3 and 19.3% in REM. Wake after sleep onset (WASO) was 89.7 minutes. Alpha intrusion was PRESENT ABSENT. Supine sleep was 3.99%. The arousal index was 10.0.   LEG MOVEMENT DATA PLM Index (/hr):          3.5       PLM Arousal Index (/hr):         0.0   CARDIAC DATA The 2 lead EKG demonstrated sinus rhythm. The mean heart rate was 60.5 beats per minute. Other EKG findings include: PACs.   IMPRESSIONS - No significant obstructive sleep apnea occurred during this study (AHI 0.9/hour). An optimal ASV pressure was selected. - Mild oxygen desaturation was noted during this study (Min O2 = 89.0). - The patient snored for % of sleep with moderate snoring volume. - EKG findings include PVCs. - Clinically significant periodic limb movements did not occur during sleep.   DIAGNOSIS - Obstructive Sleep Apnea (G47.33)   RECOMMENDATIONS - Trial of SV Advance EPAP Min 5 and Max 8 cmH2O, Pressure Support Min 3 and Max 15 cmH2O and Breath Rate of Auto BrPM - Effort should be made to optimize nasal and oropharyngeal patency. - Avoid alcohol, sedatives and other CNS depressants that may worsen sleep apnea and disrupt normal sleep architecture. - Sleep hygiene should be reviewed to assess factors that may improve sleep quality. - Weight management and regular  exercise should be initiated or continued. - Recommend a downoad in 30 days and sleep clinic evaluation after 4 weeks of therapy    ASSESSMENT:    1. Mild Aortic valve stenosis, etiology of cardiac valve disease unspecified   2. Aortic valve insufficiency, etiology of cardiac valve disease unspecified   3. Acquired dilation of ascending aorta and aortic root ([place])   4. 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 MVP (mitral valve prolapse)   6. Essential hypertension   7. H/O mixed connective tissue disease  PLAN:   Bethany Baker is a  55 year-old female who has a history of mitral valve prolapse and previously noted at least moderate aortic insufficiency.  She also has a history of sleep apnea and had been on CPAP therapy remotely and when I initially saw her she had not used CPAP in many years  When I initially saw her, she had a murmur of at least moderate aortic insufficiency as well as MR from mitral valve prolapse.  A 5-year follow-up echo Doppler study in 2018  confirmed aortic valve thickening with a mean gradient of 8, peak gradient of 14, and at least mild-to-moderate aortic insufficiency.  There was mild prolapse of anterior mitral valve leaflet with no stenosis and only trivial MR.  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  Subsequent CT angiography suggested this to be 4.7 cm involving the ascending thoracic aorta.  She underwent evaluation by Dr. Cyndia Bent and continues to see him for follow-up evaluations with his last visit in January 2022.  Her echo Doppler study from March 03, 2020  showed progressive aortic Baker now felt to be severe.  EF was 60 to 65%.  Left ventricular end-diastolic dimension was 5.5 cm and end-systolic dimension 3.6 cm.  Her ascending aorta measured 4.6 cm.  Is now recommended that she undergo follow-up echo Doppler study in 6 months for reassessment of her aortic insufficiency and chamber dimensions.  Her blood pressure today is elevated on  irbesartan 3 mg daily, metoprolol succinate which she has apparently been taking only 25 mg and her last prescription was filled I have recommended resumption of metoprolol succinate at 50 mg.  She is scheduled for a follow-up echo Doppler study on September 09, 2020 and presently sees Dr. Cyndia Bent on a yearly basis.  With reference to her sleep apnea and previous documentation of significant central events on BiPAP, she underwent successful ASV titration.  Her download today is excellent with an AHI of 0.4 with a 95th percentile pressure at 14.7/7.1 and maximal average pressure at 17.6/7.6.  I will keep her settings stable with a minimum EPAP of 5 with maximum of 8 and with pressure support range of 3 to 15 cm of water.  I will notify her regarding her upcoming echo Doppler results and see her again 6 months for follow-up evaluation or sooner as needed.    Medication Adjustments/Labs and Tests Ordered: Current medicines are reviewed at length with the patient today.  Concerns regarding medicines are outlined above.  Medication changes, Labs and Tests ordered today are listed in the Patient Instructions below. Patient Instructions  Medication Instructions:  Continue current medications  *If you need a refill on your cardiac medications before your next appointment, please call your pharmacy*   Lab Work: None Ordered   Testing/Procedures: None Ordered   Follow-Up: At [facility], you and your health needs are our priority.  As part of our continuing mission to provide you with exceptional heart care, we have created designated Provider Care Teams.  These Care Teams include your primary Cardiologist (physician) and Advanced Practice Providers (APPs -  Physician Assistants and Nurse Practitioners) who all work together to provide you with the care you need, when you need it.  We recommend signing up for the patient portal called "MyChart".  Sign up information is provided on this After Visit  Summary.  MyChart is used to connect with patients for Virtual Visits (Telemedicine).  Patients are able to view lab/test results, encounter notes, upcoming  appointments, etc.  Non-urgent messages can be sent to your provider as well.   To learn more about what you can do with MyChart, go to [email or web address].    Your next appointment:   6 month(s)  The format for your next appointment:   In Person  Provider:   You may see Shelva Majestic, MD or one of the following Advanced Practice Providers on your designated Care Team:   Almyra Deforest, PA-C Fabian Sharp, PA-C or  Roby Lofts, [place]     Signed, Shelva Majestic, MD  08/15/2020 2:08 PM    [facility] [address] [place], [place], [place]  [address] Phone: [phone]

## 2020-08-26 DIAGNOSIS — I10 Essential (primary) hypertension: Secondary | ICD-10-CM | POA: Insufficient documentation

## 2020-08-26 DIAGNOSIS — R5382 Chronic fatigue, unspecified: Secondary | ICD-10-CM | POA: Insufficient documentation

## 2020-08-26 DIAGNOSIS — F419 Anxiety disorder, unspecified: Secondary | ICD-10-CM | POA: Insufficient documentation

## 2020-09-07 DIAGNOSIS — G4731 Primary central sleep apnea: Secondary | ICD-10-CM | POA: Diagnosis not present

## 2020-09-09 ENCOUNTER — Other Ambulatory Visit: Payer: Self-pay

## 2020-09-09 ENCOUNTER — Ambulatory Visit (HOSPITAL_COMMUNITY): Payer: 59 | Attending: Cardiology

## 2020-09-09 DIAGNOSIS — I351 Nonrheumatic aortic (valve) insufficiency: Secondary | ICD-10-CM

## 2020-09-09 LAB — ECHOCARDIOGRAM COMPLETE
Area-P 1/2: 4.29 cm2
P 1/2 time: 241 msec
S' Lateral: 3.3 cm

## 2020-09-15 ENCOUNTER — Telehealth: Payer: 59 | Admitting: Cardiovascular Disease

## 2020-09-16 ENCOUNTER — Other Ambulatory Visit (HOSPITAL_BASED_OUTPATIENT_CLINIC_OR_DEPARTMENT_OTHER): Payer: Self-pay

## 2020-10-08 DIAGNOSIS — G4731 Primary central sleep apnea: Secondary | ICD-10-CM | POA: Diagnosis not present

## 2020-10-13 ENCOUNTER — Other Ambulatory Visit (HOSPITAL_BASED_OUTPATIENT_CLINIC_OR_DEPARTMENT_OTHER): Payer: Self-pay

## 2020-10-15 ENCOUNTER — Other Ambulatory Visit (HOSPITAL_BASED_OUTPATIENT_CLINIC_OR_DEPARTMENT_OTHER): Payer: Self-pay

## 2020-10-26 ENCOUNTER — Other Ambulatory Visit (HOSPITAL_BASED_OUTPATIENT_CLINIC_OR_DEPARTMENT_OTHER): Payer: Self-pay

## 2020-10-26 ENCOUNTER — Other Ambulatory Visit: Payer: Self-pay

## 2020-10-26 MED ORDER — IRBESARTAN 300 MG PO TABS
300.0000 mg | ORAL_TABLET | Freq: Every day | ORAL | 3 refills | Status: DC
  Filled 2020-10-26 – 2021-10-04 (×3): qty 90, 90d supply, fill #0

## 2020-11-03 ENCOUNTER — Other Ambulatory Visit (HOSPITAL_BASED_OUTPATIENT_CLINIC_OR_DEPARTMENT_OTHER): Payer: Self-pay

## 2020-12-03 DIAGNOSIS — E785 Hyperlipidemia, unspecified: Secondary | ICD-10-CM | POA: Diagnosis not present

## 2020-12-03 DIAGNOSIS — I712 Thoracic aortic aneurysm, without rupture, unspecified: Secondary | ICD-10-CM | POA: Diagnosis not present

## 2020-12-03 DIAGNOSIS — I10 Essential (primary) hypertension: Secondary | ICD-10-CM | POA: Diagnosis not present

## 2020-12-03 DIAGNOSIS — E039 Hypothyroidism, unspecified: Secondary | ICD-10-CM | POA: Diagnosis not present

## 2020-12-03 DIAGNOSIS — Z0000 Encounter for general adult medical examination without abnormal findings: Secondary | ICD-10-CM | POA: Diagnosis not present

## 2020-12-03 DIAGNOSIS — E559 Vitamin D deficiency, unspecified: Secondary | ICD-10-CM | POA: Diagnosis not present

## 2020-12-14 ENCOUNTER — Other Ambulatory Visit (HOSPITAL_BASED_OUTPATIENT_CLINIC_OR_DEPARTMENT_OTHER): Payer: Self-pay

## 2020-12-22 ENCOUNTER — Other Ambulatory Visit (HOSPITAL_BASED_OUTPATIENT_CLINIC_OR_DEPARTMENT_OTHER): Payer: Self-pay

## 2021-02-02 ENCOUNTER — Other Ambulatory Visit: Payer: Self-pay | Admitting: *Deleted

## 2021-02-02 DIAGNOSIS — I712 Thoracic aortic aneurysm, without rupture, unspecified: Secondary | ICD-10-CM

## 2021-02-18 ENCOUNTER — Ambulatory Visit: Payer: 59 | Admitting: Cardiovascular Disease

## 2021-03-09 ENCOUNTER — Ambulatory Visit: Payer: 59 | Admitting: Surgical

## 2021-03-09 ENCOUNTER — Ambulatory Visit
Admission: RE | Admit: 2021-03-09 | Discharge: 2021-03-09 | Disposition: A | Discharge status: Home / Self Care | Payer: 59 | Source: Ambulatory Visit | Attending: Surgery | Admitting: Surgery

## 2021-03-09 ENCOUNTER — Other Ambulatory Visit: Payer: Self-pay

## 2021-03-09 DIAGNOSIS — I712 Thoracic aortic aneurysm, without rupture, unspecified: Secondary | ICD-10-CM | POA: Diagnosis not present

## 2021-03-09 MED ORDER — IOPAMIDOL (ISOVUE-370) INJECTION 76%
75.0000 mL | Freq: Once | INTRAVENOUS | Status: AC | PRN
  Administered 2021-03-09: 75 mL via INTRAVENOUS

## 2021-03-09 NOTE — Progress Notes (Signed)
Subjective:    Patient ID: Bethany Baker, female    DOB: March 20, 1965, 56 y.o.   MRN: [id number]  Chief Complaint  Patient presents with   Thoracic Aortic Aneurysm    Yearly f/u with Chest CTA    HPI Patient is in today for follow-up aortic aneurysm.  Currently she reports that she has a fairly low energy level.  She does note sleep disturbances that she relates primarily to work schedule.  She denies chest pain or shortness of breath.  She tries not to exert herself very much.  She says she has a hiatal hernia that sometimes gives her symptomatic reflux/chest discomfort she denies palpitations or lower extremity edema.  Past Medical History:  Diagnosis Date   Aortic insufficiency    Depression    Fibromyalgia    MR (mitral regurgitation)    MVP (mitral valve prolapse)    OSA (obstructive sleep apnea) 06/10/2019   Systolic murmur    Thyroid disease    HYPOTHYROIDISM    Past Surgical History:  Procedure Laterality Date   TRANSESOPHAGEAL ECHOCARDIOGRAM      Family History  Problem Relation Age of Onset   Mitral valve prolapse Mother    Cancer Mother    Fibromyalgia Mother    Hypertension Father    Heart failure Father    Hypertension Brother     Social History   Socioeconomic History   Marital status: Single    Spouse name: Not on file   Number of children: Not on file   Years of education: Not on file   Highest education level: Not on file  Occupational History   Not on file  Tobacco Use   Smoking status: Never   Smokeless tobacco: Never  Substance and Sexual Activity   Alcohol use: No   Drug use: No   Sexual activity: Not on file  Other Topics Concern   Not on file  Social History Narrative   Not on file   Social Determinants of Health   Financial Resource Strain: Not on file  Food Insecurity: Not on file  Transportation Needs: Not on file  Physical Activity: Not on file  Stress: Not on file  Social Connections: Not on file  Intimate Partner  Violence: Not on file    Outpatient Medications Prior to Visit  Medication Sig Dispense Refill   ALPRAZolam (XANAX) 0.5 MG tablet Take 0.5 mg by mouth at bedtime as needed for anxiety.     amphetamine-dextroamphetamine (ADDERALL) 15 MG tablet Take by mouth daily. Pt takes 0.5 tablet     diclofenac Sodium (VOLTAREN) 1 % GEL 4 grams     esomeprazole (NEXIUM) 20 MG capsule Take 20 mg by mouth daily at 12 noon.     FLUoxetine (PROZAC) 40 MG capsule take 1 capsule by mouth every morning 90 capsule 1   hydroxychloroquine (PLAQUENIL) 200 MG tablet 2 tablets     irbesartan (AVAPRO) 300 MG tablet Take 1 tablet (300 mg total) by mouth daily. 90 tablet 3   lidocaine (LIDODERM) 5 % 1 patch to intact skin remove after 12 hours     loratadine (CLARITIN) 10 MG tablet Take 10 mg by mouth daily.     metoprolol succinate (TOPROL-XL) 50 MG 24 hr tablet Take 1 tablet (50 mg total) by mouth daily. 90 tablet 3   montelukast (SINGULAIR) 10 MG tablet 1 tablet in the evening     Multiple Vitamin (MULTIVITAMIN) tablet Take 1 tablet by mouth daily.  albuterol (VENTOLIN HFA) 108 (90 Base) MCG/ACT inhaler INHALE 2 PUFFS BY MOUTH EVERY 4 HOURS AS NEEDED FOR SHORTNESS OF BREATH OR WHEEZING 18 g 1   levothyroxine (SYNTHROID) 50 MCG tablet TAKE ONE TABLET BY MOUTH ON AN EMPTY STOMACH DAILY IN THE MORNING AND AN EXTRA 1/2 TABLET ON SUNDAYS 90 tablet 1   liothyronine (CYTOMEL) 5 MCG tablet TAKE ONE TABLET BY MOUTH TWICE DAILY ON AN EMPTY STOMACH 180 tablet 1   pantoprazole (PROTONIX) 40 MG tablet TAKE ONE TABLET BY MOUTH ONCE DAILY 90 tablet 3   Loratadine 10 MG CAPS 1 capsule     montelukast (SINGULAIR) 10 MG tablet TAKE 1 TABLET BY MOUTH EVERY EVENING 90 tablet 1   No facility-administered medications prior to visit.    Allergies  Allergen Reactions   Acetaminophen    Adhesive [Tape]     irritation   Aspirin    Effexor [Venlafaxine]     Paranoid when driving   Eggs Or Egg-Derived Products Other (See Comments)    Ibuprofen    Mercury Swelling   Wellbutrin [Bupropion]     Paranoid while driving   Atenolol     Memory loss        Objective:    Physical Exam Constitutional:      Appearance: Normal appearance. She is not ill-appearing.  HENT:     Head: Normocephalic and atraumatic.  Eyes:     General: No scleral icterus.    Conjunctiva/sclera: Conjunctivae normal.  Cardiovascular:     Heart sounds: Murmur heard.  Pulmonary:     Effort: Pulmonary effort is normal.     Breath sounds: Normal breath sounds.  Abdominal:     General: Abdomen is flat.     Palpations: Abdomen is soft.  Skin:    General: Skin is warm and dry.  Neurological:     General: No focal deficit present.     Mental Status: She is alert.  Psychiatric:        Mood and Affect: Mood normal.        Behavior: Behavior normal.    BP 138/80    Pulse (!) 110    Resp 20    Ht 5' 4.5" (1.638 m)    Wt 175 lb (79.4 kg)    SpO2 97% Comment: RA   BMI 29.57 kg/m  Wt Readings from Last 3 Encounters:  03/09/21 175 lb (79.4 kg)  08/14/20 175 lb 3.2 oz (79.5 kg)  05/05/20 170 lb (77.1 kg)    Health Maintenance Due  Topic Date Due   COVID-19 Vaccine (1) Never done   Pneumococcal Vaccine 4519-55 Years old (1 - PCV) Never done   HIV Screening  Never done   Hepatitis C Screening  Never done   Zoster Vaccines- Shingrix (1 of 2) Never done   PAP SMEAR-Modifier  04/15/2011   COLONOSCOPY (Pts 45-7071yrs Insurance coverage will need to be confirmed)  Never done   MAMMOGRAM  Never done   INFLUENZA VACCINE  09/28/2020    There are no preventive care reminders to display for this patient.   Lab Results  Component Value Date   TSH 2.956 09/07/2007   Lab Results  Component Value Date   WBC 5.7 10/20/2008   HGB 12.0 10/20/2008   HCT 36.1 10/20/2008   MCV 89.8 10/20/2008   PLT 227 10/20/2008   Lab Results  Component Value Date   NA 138 10/20/2008   K 3.5 10/20/2008   CO2 29 10/20/2008  GLUCOSE 86 10/20/2008   BUN 9  10/20/2008   CREATININE 0.70 10/20/2008   BILITOT 0.6 10/20/2008   ALKPHOS 74 10/20/2008   AST 31 10/20/2008   ALT 25 10/20/2008   PROT 6.8 10/20/2008   ALBUMIN 3.9 10/20/2008   CALCIUM 8.7 10/20/2008   Lab Results  Component Value Date   CHOL 186 09/07/2007   Lab Results  Component Value Date   HDL 72 09/07/2007   Lab Results  Component Value Date   LDLCALC 98 09/07/2007   Lab Results  Component Value Date   TRIG 80 09/07/2007   Lab Results  Component Value Date   CHOLHDL 2.6 Ratio 09/07/2007   No results found for: HGBA1C     CT ANGIO CHEST AORTA W/CM & OR WO/CM  Result Date: 03/09/2021 CLINICAL DATA:  Thoracic aortic aneurysm, follow-up EXAM: CT ANGIOGRAPHY CHEST WITH CONTRAST TECHNIQUE: Multidetector CT imaging of the chest was performed using the standard protocol during bolus administration of intravenous contrast. Multiplanar CT image reconstructions and MIPs were obtained to evaluate the vascular anatomy. CONTRAST:  75mL ISOVUE-370 IOPAMIDOL (ISOVUE-370) INJECTION 76% COMPARISON:  03/04/2020 and previous FINDINGS: Cardiovascular: Heart size upper limits normal. Small volume pericardial fluid. The RV is nondilated. Satisfactory opacification of pulmonary arteries noted, and there is no evidence of pulmonary emboli. Good contrast opacification of the thoracic aorta. No dissection or stenosis. 4.6 cm fusiform ascending aortic aneurysm, previously 4.5 cm. Bovine variant brachiocephalic arterial origin anatomy without proximal stenosis. Descending thoracic aorta visualized proximal abdominal aorta normal in caliber. Mediastinum/Nodes: No mediastinal hemorrhage, mass or adenopathy. Small hiatal hernia. Lungs/Pleura: Lungs clear.  No pleural effusion. Upper Abdomen: Cholecystectomy clips.  No acute findings. Musculoskeletal: No chest wall abnormality. No acute or significant osseous findings. Review of the MIP images confirms the above findings. IMPRESSION: 1. 4.6 cm ascending  thoracic aortic aneurysm without complicating features. Recommend semi-annual imaging followup by CTA or MRA and referral to cardiothoracic surgery if not already obtained. This recommendation follows 2010 ACCF/AHA/AATS/ACR/ASA/SCA/SCAI/SIR/STS/SVM Guidelines for the Diagnosis and Management of Patients With Thoracic Aortic Disease. Circulation. 2010; 121: P102-H852: [id number] Electronically Signed   By: Corlis Leak  Hassell M.D.   On: 03/09/2021 13:02         ECHOCARDIOGRAM REPORT         Patient Name:   Bethany Baker Date of Exam: 09/09/2020  Medical Rec #:  [id number]     Height:       64.5 in  Accession #:    [id number]    Weight:       175.2 lb  Date of Birth:  1965-06-03     BSA:          1.859 m  Patient Age:    54 years      BP:           132/80 mmHg  Patient Gender: F             HR:           103 bpm.  Exam Location:  [address]   Procedure: 2D Echo, 3D Echo, Cardiac Doppler, Color Doppler and Strain  Analysis   Indications:    I35.1 Aortic insufficiency     History:        Patient has prior history of Echocardiogram examinations,  most                  recent 03/03/2020. Mitral Valve Prolapse,  Signs/Symptoms:Murmur;  Risk Factors:Sleep Apnea.     Sonographer:    Garald Braver, RDCS  Referring Phys: [phone] THOMAS A KELLY   IMPRESSIONS     1. Left ventricular ejection fraction, by estimation, is 60 to 65%. The  left ventricle has normal function. The left ventricle has no regional  wall motion abnormalities. There is mild concentric left ventricular  hypertrophy. Left ventricular diastolic  parameters are consistent with Grade I diastolic dysfunction (impaired  relaxation). The average left ventricular global longitudinal strain is  -23.8 %. The global longitudinal strain is normal.   2. Right ventricular systolic function is normal. The right ventricular  size is normal.   3. The mitral valve is normal in structure. Trivial mitral valve  regurgitation. No evidence of  mitral stenosis.   4. The aortic valve was not well visualized. Aortic valve regurgitation  is severe.      There is mild diastolic flow reversal seen in the aortic arch  consistent with severe AI. Mild aortic valve sclerosis is present, with no  evidence of aortic valve stenosis.   5. Aortic dilatation noted. There is moderate dilatation of the ascending  aorta, measuring 47 mm.   6. The inferior vena cava is normal in size with greater than 50%  respiratory variability, suggesting right atrial pressure of 3 mmHg.   Comparison(s): Compared to prior TTE in 02/2020, there continues to be  severe AI. The ascending aorta now measures 47mm from 46mm.   FINDINGS   Left Ventricle: Left ventricular ejection fraction, by estimation, is 60  to 65%. The left ventricle has normal function. The left ventricle has no  regional wall motion abnormalities. The average left ventricular global  longitudinal strain is -23.8 %.  The global longitudinal strain is normal. 3D left ventricular ejection  fraction analysis performed but not reported based on interpreter  judgement due to suboptimal quality. The left ventricular internal cavity  size was normal in size. There is mild  concentric left ventricular hypertrophy. Left ventricular diastolic  parameters are consistent with Grade I diastolic dysfunction (impaired  relaxation).   Right Ventricle: The right ventricular size is normal. No increase in  right ventricular wall thickness. Right ventricular systolic function is  normal.   Left Atrium: Left atrial size was normal in size.   Right Atrium: Right atrial size was normal in size.   Pericardium: There is no evidence of pericardial effusion.   Mitral Valve: The mitral valve is normal in structure. There is mild  thickening of the mitral valve leaflet(s). There is mild calcification of  the mitral valve leaflet(s). Trivial mitral valve regurgitation. No  evidence of mitral valve stenosis.    Tricuspid Valve: The tricuspid valve is normal in structure. Tricuspid  valve regurgitation is trivial.   Aortic Valve: The aortic valve was not well visualized. Aortic valve  regurgitation is severe. Aortic regurgitation PHT measures 241 msec. There  is mild diastolic flow reversal seen in the aortic arch consistent with  severe AI. Mild aortic valve sclerosis   is present, with no evidence of aortic valve stenosis.   Pulmonic Valve: The pulmonic valve was normal in structure. Pulmonic valve  regurgitation is trivial.   Aorta: Aortic dilatation noted. There is moderate dilatation of the  ascending aorta, measuring 47 mm.   Venous: The inferior vena cava is normal in size with greater than 50%  respiratory variability, suggesting right atrial pressure of 3 mmHg.   IAS/Shunts: No atrial level shunt detected by color  flow Doppler.      LEFT VENTRICLE  PLAX 2D  LVIDd:         5.20 cm  Diastology  LVIDs:         3.30 cm  LV e' medial:    4.62 cm/s  LV PW:         1.10 cm  LV E/e' medial:  13.0  LV IVS:        1.10 cm  LV e' lateral:   7.62 cm/s  LVOT diam:     2.20 cm  LV E/e' lateral: 7.9  LV SV:         87  LV SV Index:   47       2D Longitudinal Strain  LVOT Area:     3.80 cm 2D Strain GLS (A2C):   -28.7 %                          2D Strain GLS (A3C):   -20.6 %                          2D Strain GLS (A4C):   -22.1 %                          2D Strain GLS Avg:     -23.8 %                             3D Volume EF:                          3D EF:        58 %                          LV EDV:       120 ml                          LV ESV:       50 ml                          LV SV:        70 ml   RIGHT VENTRICLE  RV Basal diam:  2.30 cm  RV S prime:     10.20 cm/s  TAPSE (M-mode): 1.6 cm   LEFT ATRIUM             Index       RIGHT ATRIUM           Index  LA diam:        4.10 cm 2.21 cm/m  RA Pressure: 3.00 mmHg  LA Vol (A2C):   23.5 ml 12.64 ml/m RA Area:     10.70 cm   LA Vol (A4C):   26.3 ml 14.15 ml/m RA Volume:   21.10 ml  11.35 ml/m  LA Biplane Vol: 25.3 ml 13.61 ml/m   AORTIC VALVE  LVOT Vmax:   117.00 cm/s  LVOT Vmean:  79.700 cm/s  LVOT VTI:    0.230 m  AI PHT:      241 msec     AORTA  Ao Root diam: 3.50 cm  Ao Asc diam:  4.70 cm  MITRAL VALVE                TRICUSPID VALVE                              Estimated RAP:  3.00 mmHg     MV E velocity: 60.10 cm/s   SHUNTS  MV A velocity: 123.00 cm/s  Systemic VTI:  0.23 m  MV E/A ratio:  0.49         Systemic Diam: 2.20 cm   Laurance Flatten MD  Electronically signed by Laurance Flatten MD  Signature Date/Time: 09/09/2020/3:01:16 PM        Assessment & Plan:   Problem List Items Addressed This Visit     Thoracic aortic aneurysm without rupture   Assessment/plan: Due to the increasing size from 4.5 to 4.6 cm of the thoracic aortic aneurysm in combination with severe aortic insufficiency and we will arrange for the patient to see Dr. Laneta Simmers in 1 to 2 weeks as she is approaching need for surgical intervention..  No orders of the defined types were placed in this encounter.    Rowe Clack, PA-C

## 2021-03-09 NOTE — Patient Instructions (Signed)
Follow-up in the next 2 weeks with Dr. Laneta Simmers

## 2021-03-18 ENCOUNTER — Other Ambulatory Visit: Payer: Self-pay

## 2021-03-18 ENCOUNTER — Encounter: Payer: Self-pay | Admitting: Surgery

## 2021-03-18 ENCOUNTER — Ambulatory Visit: Payer: 59 | Admitting: Surgery

## 2021-03-18 VITALS — BP 143/83 | HR 89 | Resp 20 | Ht 64.5 in | Wt 175.0 lb

## 2021-03-18 DIAGNOSIS — I712 Thoracic aortic aneurysm, without rupture, unspecified: Secondary | ICD-10-CM | POA: Diagnosis not present

## 2021-03-18 NOTE — Progress Notes (Signed)
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  She had a follow-up echocardiogram on 03/03/2020 which showed progression to severe aortic insufficiency with a pressure half-time of 301 ms.  Her most recent echo on 09/09/2020 showed severe aortic insufficiency with an AI pressure half-time of 241 ms.  There is mild diastolic flow reversal seen in the aortic arch consistent with severe aortic insufficiency.  Left ventricular ejection fraction is normal with an LV internal dimension of 5.2 cm during diastole and 3.3 cm during systole.  The ascending aorta was measured at 47 mm.  A repeat chest CTA was done on 03/09/2021 showing the maximum diameter of the ascending aortic aneurysm to be 4.6 cm compared to the measurement in January when it was 4.5 cm.  The descending aorta at the same level is about 2.4 cm.  She reports development of exertional fatigue and probably some shortness of breath but denies any peripheral edema or orthopnea.  Current Outpatient Medications  Medication Sig Dispense Refill   ALPRAZolam (XANAX) 0.5 MG tablet Take 0.5 mg by mouth at bedtime as needed for anxiety.     amphetamine-dextroamphetamine (ADDERALL) 15 MG tablet Take by mouth daily. Pt takes 0.5 tablet     diclofenac Sodium (VOLTAREN) 1 % GEL 4 grams     esomeprazole (NEXIUM) 20 MG capsule Take 20 mg by mouth daily at 12 noon.     FLUoxetine (PROZAC) 40 MG capsule take 1 capsule by mouth every morning 90 capsule 1   hydroxychloroquine (PLAQUENIL) 200 MG tablet 2 tablets     irbesartan (AVAPRO) 300 MG tablet Take 1 tablet (300 mg total) by mouth daily. 90 tablet 3   lidocaine (LIDODERM) 5 % 1 patch to intact skin remove after 12 hours     loratadine (CLARITIN) 10 MG tablet Take 10 mg by mouth daily.     metoprolol succinate (TOPROL-XL) 50 MG 24 hr tablet Take  1 tablet (50 mg total) by mouth daily. 90 tablet 3   montelukast (SINGULAIR) 10 MG tablet 1 tablet in the evening     Multiple Vitamin (MULTIVITAMIN) tablet Take 1 tablet by mouth daily.     albuterol (VENTOLIN HFA) 108 (90 Base) MCG/ACT inhaler INHALE 2 PUFFS BY MOUTH EVERY 4 HOURS AS NEEDED FOR SHORTNESS OF BREATH OR WHEEZING 18 g 1   levothyroxine (SYNTHROID) 50 MCG tablet TAKE ONE TABLET BY MOUTH ON AN EMPTY STOMACH DAILY IN THE MORNING AND AN EXTRA 1/2 TABLET ON SUNDAYS 90 tablet 1   liothyronine (CYTOMEL) 5 MCG tablet TAKE ONE TABLET BY MOUTH TWICE DAILY ON AN EMPTY STOMACH 180 tablet 1   pantoprazole (PROTONIX) 40 MG tablet TAKE ONE TABLET BY MOUTH ONCE DAILY 90 tablet 3   No current facility-administered medications for this visit.     Physical Exam: BP (!) 143/83 (BP Location: Left Arm, Patient Position: Sitting, Cuff Size: Normal)    Pulse 89    Resp 20    Ht 5' 4.5" (1.638 m)    Wt 175 lb (79.4 kg)    SpO2 95% Comment: RA   BMI 29.57 kg/m  She looks well. Cardiac exam shows a regular rate and rhythm with a 3/6 diastolic murmur along left lower sternal border. Lung exam is clear. There is no peripheral edema.  Diagnostic Tests:  ECHOCARDIOGRAM REPORT  Patient Name:   Bethany Baker Date of Exam: 09/09/2020  Medical Rec #:  [id number]     Height:       64.5 in  Accession #:    [id number]    Weight:       175.2 lb  Date of Birth:  1965-04-15     BSA:          1.859 m  Patient Age:    41 years      BP:           132/80 mmHg  Patient Gender: F             HR:           103 bpm.  Exam Location:  [place]   Procedure: 2D Echo, 3D Echo, Cardiac Doppler, Color Doppler and Strain  Analysis   Indications:    I35.1 Aortic insufficiency     History:        Patient has prior history of Echocardiogram examinations,  most                  recent 03/03/2020. Mitral Valve Prolapse,  Signs/Symptoms:Murmur;                  Risk Factors:Sleep Apnea.     Sonographer:     Jessee Avers, RDCS  Referring Phys: [place]     1. Left ventricular ejection fraction, by estimation, is 60 to 65%. The  left ventricle has normal function. The left ventricle has no regional  wall motion abnormalities. There is mild concentric left ventricular  hypertrophy. Left ventricular diastolic  parameters are consistent with Grade I diastolic dysfunction (impaired  relaxation). The average left ventricular global longitudinal strain is  -23.8 %. The global longitudinal strain is normal.   2. Right ventricular systolic function is normal. The right ventricular  size is normal.   3. The mitral valve is normal in structure. Trivial mitral valve  regurgitation. No evidence of mitral stenosis.   4. The aortic valve was not well visualized. Aortic valve regurgitation  is severe.      There is mild diastolic flow reversal seen in the aortic arch  consistent with severe AI. Mild aortic valve sclerosis is present, with no  evidence of aortic valve stenosis.   5. Aortic dilatation noted. There is moderate dilatation of the ascending  aorta, measuring 47 mm.   6. The inferior vena cava is normal in size with greater than 50%  respiratory variability, suggesting right atrial pressure of 3 mmHg.   Comparison(s): Compared to prior TTE in 02/2020, there continues to be  severe AI. The ascending aorta now measures 32mm from 16mm.   FINDINGS   Left Ventricle: Left ventricular ejection fraction, by estimation, is 60  to 65%. The left ventricle has normal function. The left ventricle has no  regional wall motion abnormalities. The average left ventricular global  longitudinal strain is -23.8 %.  The global longitudinal strain is normal. 3D left ventricular ejection  fraction analysis performed but not reported based on interpreter  judgement due to suboptimal quality. The left ventricular internal cavity  size was normal in size. There is mild  concentric left  ventricular hypertrophy. Left ventricular diastolic  parameters are consistent with Grade I diastolic dysfunction (impaired  relaxation).   Right Ventricle: The right ventricular size is normal. No increase in  right ventricular wall thickness. Right ventricular systolic function is  normal.   Left Atrium: Left atrial size was normal in size.   Right Atrium: Right atrial size was normal in size.   Pericardium: There is no evidence of pericardial effusion.   Mitral Valve: The mitral valve is normal in structure. There is mild  thickening of the mitral valve leaflet(s). There is mild calcification of  the mitral valve leaflet(s). Trivial mitral valve regurgitation. No  evidence of mitral valve stenosis.   Tricuspid Valve: The tricuspid valve is normal in structure. Tricuspid  valve regurgitation is trivial.   Aortic Valve: The aortic valve was not well visualized. Aortic valve  regurgitation is severe. Aortic regurgitation PHT measures 241 msec. There  is mild diastolic flow reversal seen in the aortic arch consistent with  severe AI. Mild aortic valve sclerosis   is present, with no evidence of aortic valve stenosis.   Pulmonic Valve: The pulmonic valve was normal in structure. Pulmonic valve  regurgitation is trivial.   Aorta: Aortic dilatation noted. There is moderate dilatation of the  ascending aorta, measuring 47 mm.   Venous: The inferior vena cava is normal in size with greater than 50%  respiratory variability, suggesting right atrial pressure of 3 mmHg.   IAS/Shunts: No atrial level shunt detected by color flow Doppler.      LEFT VENTRICLE  PLAX 2D  LVIDd:         5.20 cm  Diastology  LVIDs:         3.30 cm  LV e' medial:    4.62 cm/s  LV PW:         1.10 cm  LV E/e' medial:  13.0  LV IVS:        1.10 cm  LV e' lateral:   7.62 cm/s  LVOT diam:     2.20 cm  LV E/e' lateral: 7.9  LV SV:         87  LV SV Index:   47       2D Longitudinal Strain  LVOT Area:      3.80 cm 2D Strain GLS (A2C):   -28.7 %                          2D Strain GLS (A3C):   -20.6 %                          2D Strain GLS (A4C):   -22.1 %                          2D Strain GLS Avg:     -23.8 %                             3D Volume EF:                          3D EF:        58 %                          LV EDV:       120 ml                          LV ESV:       50  ml                          LV SV:        70 ml   RIGHT VENTRICLE  RV Basal diam:  2.30 cm  RV S prime:     10.20 cm/s  TAPSE (M-mode): 1.6 cm   LEFT ATRIUM             Index       RIGHT ATRIUM           Index  LA diam:        4.10 cm 2.21 cm/m  RA Pressure: 3.00 mmHg  LA Vol (A2C):   23.5 ml 12.64 ml/m RA Area:     10.70 cm  LA Vol (A4C):   26.3 ml 14.15 ml/m RA Volume:   21.10 ml  11.35 ml/m  LA Biplane Vol: 25.3 ml 13.61 ml/m   AORTIC VALVE  LVOT Vmax:   117.00 cm/s  LVOT Vmean:  79.700 cm/s  LVOT VTI:    0.230 m  AI PHT:      241 msec     AORTA  Ao Root diam: 3.50 cm  Ao Asc diam:  4.70 cm   MITRAL VALVE                TRICUSPID VALVE                              Estimated RAP:  3.00 mmHg     MV E velocity: 60.10 cm/s   SHUNTS  MV A velocity: 123.00 cm/s  Systemic VTI:  0.23 m  MV E/A ratio:  0.49         Systemic Diam: 2.20 cm   Laurance Flatten MD  Electronically signed by Laurance Flatten MD  Signature Date/Time: 09/09/2020/3:01:16 PM         Final     Narrative & Impression  CLINICAL DATA:  Thoracic aortic aneurysm, follow-up   EXAM: CT ANGIOGRAPHY CHEST WITH CONTRAST   TECHNIQUE: Multidetector CT imaging of the chest was performed using the standard protocol during bolus administration of intravenous contrast. Multiplanar CT image reconstructions and MIPs were obtained to evaluate the vascular anatomy.   CONTRAST:  55mL ISOVUE-370 IOPAMIDOL (ISOVUE-370) INJECTION 76%   COMPARISON:  03/04/2020 and previous   FINDINGS: Cardiovascular: Heart size upper limits  normal. Small volume pericardial fluid. The RV is nondilated. Satisfactory opacification of pulmonary arteries noted, and there is no evidence of pulmonary emboli. Good contrast opacification of the thoracic aorta. No dissection or stenosis.   4.6 cm fusiform ascending aortic aneurysm, previously 4.5 cm. Bovine variant brachiocephalic arterial origin anatomy without proximal stenosis. Descending thoracic aorta visualized proximal abdominal aorta normal in caliber.   Mediastinum/Nodes: No mediastinal hemorrhage, mass or adenopathy. Small hiatal hernia.   Lungs/Pleura: Lungs clear.  No pleural effusion.   Upper Abdomen: Cholecystectomy clips.  No acute findings.   Musculoskeletal: No chest wall abnormality. No acute or significant osseous findings.   Review of the MIP images confirms the above findings.   IMPRESSION: 1. 4.6 cm ascending thoracic aortic aneurysm without complicating features. Recommend semi-annual imaging followup by CTA or MRA and referral to cardiothoracic surgery if not already obtained. This recommendation follows 2010 ACCF/AHA/AATS/ACR/ASA/SCA/SCAI/SIR/STS/SVM Guidelines for the Diagnosis and Management of Patients With Thoracic Aortic Disease. Circulation. 2010; 121: W546-E703     Electronically Signed  By: Lucrezia Europe M.D.   On: 03/09/2021 13:02     Impression:  This 56 year old woman has stage D, severe, symptomatic aortic insufficiency with New York Heart Association class II symptoms of exertional fatigue and shortness of breath consistent with diastolic congestive heart failure.  She also has a 4.6 cm fusiform ascending aortic aneurysm that extends up to the proximal aortic arch.  This seems to begin at about the level of the sinotubular junction.  The aortic root itself appears to be of fairly normal diameter and was measured at 3.5 cm on echocardiogram.  With development of some symptoms and severe aortic insufficiency I think it would be best to  proceed with surgical treatment to prevent progressive left ventricular dysfunction.  She would require replacement of her ascending aorta, possibly supra coronary, and replacement of her aortic valve.  If there is not enough normal caliber aorta above her valve then she would require Bentall procedure.  She will require either a coronary CTA or cardiac catheterization to evaluate her coronaries preoperatively.  I discussed the alternatives of mechanical and bioprosthetic valves.  She is going to think about that but is inclined to use a bioprosthetic valve so she does not need to be on Coumadin.  I told her that I did not think there is any urgency to perform surgery but she is at the point where we need to make a plan for surgery and schedule it.  Plan:  She will call us to schedule aortic valve replacement and replacement of the ascending aorta under circulatory arrest and possible Bentall procedure depending on the anatomy.  She will require preoperative coronary CTA or cardiac catheterization and I would leave that option up to Dr. Claiborne Billings.  I spent 20 minutes performing this established patient evaluation and > 50% of this time was spent face to face counseling and coordinating the care of this patient's aortic aneurysm and severe aortic insufficiency   Gaye Pollack, MD [facility] [phone]

## 2021-06-03 DIAGNOSIS — E785 Hyperlipidemia, unspecified: Secondary | ICD-10-CM | POA: Diagnosis not present

## 2021-06-16 ENCOUNTER — Ambulatory Visit: Payer: 59 | Admitting: Cardiovascular Disease

## 2021-06-29 ENCOUNTER — Other Ambulatory Visit (HOSPITAL_BASED_OUTPATIENT_CLINIC_OR_DEPARTMENT_OTHER): Payer: Self-pay

## 2021-06-29 DIAGNOSIS — R5383 Other fatigue: Secondary | ICD-10-CM | POA: Diagnosis not present

## 2021-06-29 DIAGNOSIS — E785 Hyperlipidemia, unspecified: Secondary | ICD-10-CM | POA: Diagnosis not present

## 2021-06-29 DIAGNOSIS — I10 Essential (primary) hypertension: Secondary | ICD-10-CM | POA: Diagnosis not present

## 2021-06-29 DIAGNOSIS — K219 Gastro-esophageal reflux disease without esophagitis: Secondary | ICD-10-CM | POA: Diagnosis not present

## 2021-06-29 DIAGNOSIS — M1990 Unspecified osteoarthritis, unspecified site: Secondary | ICD-10-CM | POA: Diagnosis not present

## 2021-06-29 DIAGNOSIS — E559 Vitamin D deficiency, unspecified: Secondary | ICD-10-CM | POA: Diagnosis not present

## 2021-06-29 DIAGNOSIS — F900 Attention-deficit hyperactivity disorder, predominantly inattentive type: Secondary | ICD-10-CM | POA: Diagnosis not present

## 2021-06-29 DIAGNOSIS — E039 Hypothyroidism, unspecified: Secondary | ICD-10-CM | POA: Diagnosis not present

## 2021-06-29 MED ORDER — AMPHETAMINE-DEXTROAMPHETAMINE 5 MG PO TABS
ORAL_TABLET | ORAL | 0 refills | Status: AC
  Filled 2021-06-29: qty 60, 30d supply, fill #0

## 2021-08-05 ENCOUNTER — Other Ambulatory Visit (HOSPITAL_BASED_OUTPATIENT_CLINIC_OR_DEPARTMENT_OTHER): Payer: Self-pay

## 2021-08-05 DIAGNOSIS — F332 Major depressive disorder, recurrent severe without psychotic features: Secondary | ICD-10-CM | POA: Diagnosis not present

## 2021-08-05 MED ORDER — SERTRALINE HCL 50 MG PO TABS
ORAL_TABLET | ORAL | 0 refills | Status: DC
  Filled 2021-08-05: qty 30, 30d supply, fill #0

## 2021-08-05 MED ORDER — FLUOXETINE HCL 20 MG PO CAPS
ORAL_CAPSULE | ORAL | 0 refills | Status: DC
  Filled 2021-08-05: qty 14, 14d supply, fill #0

## 2021-08-12 ENCOUNTER — Other Ambulatory Visit (HOSPITAL_BASED_OUTPATIENT_CLINIC_OR_DEPARTMENT_OTHER): Payer: Self-pay

## 2021-08-12 DIAGNOSIS — E663 Overweight: Secondary | ICD-10-CM | POA: Diagnosis not present

## 2021-08-12 DIAGNOSIS — G4733 Obstructive sleep apnea (adult) (pediatric): Secondary | ICD-10-CM | POA: Diagnosis not present

## 2021-08-12 DIAGNOSIS — I10 Essential (primary) hypertension: Secondary | ICD-10-CM | POA: Diagnosis not present

## 2021-08-12 DIAGNOSIS — E785 Hyperlipidemia, unspecified: Secondary | ICD-10-CM | POA: Diagnosis not present

## 2021-08-12 MED ORDER — WEGOVY 0.5 MG/0.5ML ~~LOC~~ SOAJ
SUBCUTANEOUS | 0 refills | Status: DC
  Filled 2021-08-12 – 2021-09-20 (×2): qty 2, 28d supply, fill #0

## 2021-08-12 MED ORDER — WEGOVY 0.25 MG/0.5ML ~~LOC~~ SOAJ
SUBCUTANEOUS | 0 refills | Status: DC
  Filled 2021-08-12: qty 2, 28d supply, fill #0

## 2021-08-12 MED ORDER — WEGOVY 1 MG/0.5ML ~~LOC~~ SOAJ
SUBCUTANEOUS | 0 refills | Status: DC
  Filled 2021-08-12 – 2021-11-10 (×2): qty 2, 28d supply, fill #0

## 2021-08-13 ENCOUNTER — Other Ambulatory Visit (HOSPITAL_BASED_OUTPATIENT_CLINIC_OR_DEPARTMENT_OTHER): Payer: Self-pay

## 2021-08-16 ENCOUNTER — Other Ambulatory Visit (HOSPITAL_BASED_OUTPATIENT_CLINIC_OR_DEPARTMENT_OTHER): Payer: Self-pay

## 2021-08-23 ENCOUNTER — Other Ambulatory Visit (HOSPITAL_BASED_OUTPATIENT_CLINIC_OR_DEPARTMENT_OTHER): Payer: Self-pay

## 2021-08-24 ENCOUNTER — Other Ambulatory Visit (HOSPITAL_BASED_OUTPATIENT_CLINIC_OR_DEPARTMENT_OTHER): Payer: Self-pay

## 2021-08-26 ENCOUNTER — Other Ambulatory Visit: Payer: Self-pay

## 2021-08-30 ENCOUNTER — Other Ambulatory Visit (HOSPITAL_BASED_OUTPATIENT_CLINIC_OR_DEPARTMENT_OTHER): Payer: Self-pay

## 2021-09-01 ENCOUNTER — Other Ambulatory Visit (HOSPITAL_BASED_OUTPATIENT_CLINIC_OR_DEPARTMENT_OTHER): Payer: Self-pay

## 2021-09-17 ENCOUNTER — Other Ambulatory Visit (HOSPITAL_BASED_OUTPATIENT_CLINIC_OR_DEPARTMENT_OTHER): Payer: Self-pay

## 2021-09-20 ENCOUNTER — Other Ambulatory Visit (HOSPITAL_BASED_OUTPATIENT_CLINIC_OR_DEPARTMENT_OTHER): Payer: Self-pay

## 2021-09-20 MED ORDER — SERTRALINE HCL 50 MG PO TABS
50.0000 mg | ORAL_TABLET | Freq: Every day | ORAL | 0 refills | Status: DC
  Filled 2021-09-20: qty 30, 30d supply, fill #0

## 2021-09-21 ENCOUNTER — Other Ambulatory Visit (HOSPITAL_BASED_OUTPATIENT_CLINIC_OR_DEPARTMENT_OTHER): Payer: Self-pay

## 2021-10-04 ENCOUNTER — Other Ambulatory Visit (HOSPITAL_BASED_OUTPATIENT_CLINIC_OR_DEPARTMENT_OTHER): Payer: Self-pay

## 2021-10-12 ENCOUNTER — Other Ambulatory Visit (HOSPITAL_BASED_OUTPATIENT_CLINIC_OR_DEPARTMENT_OTHER): Payer: Self-pay

## 2021-10-12 ENCOUNTER — Encounter (HOSPITAL_BASED_OUTPATIENT_CLINIC_OR_DEPARTMENT_OTHER): Payer: Self-pay

## 2021-10-14 ENCOUNTER — Other Ambulatory Visit (HOSPITAL_BASED_OUTPATIENT_CLINIC_OR_DEPARTMENT_OTHER): Payer: Self-pay

## 2021-10-27 ENCOUNTER — Ambulatory Visit: Payer: 59 | Admitting: Cardiovascular Disease

## 2021-11-10 ENCOUNTER — Other Ambulatory Visit (HOSPITAL_BASED_OUTPATIENT_CLINIC_OR_DEPARTMENT_OTHER): Payer: Self-pay

## 2021-11-10 DIAGNOSIS — E785 Hyperlipidemia, unspecified: Secondary | ICD-10-CM | POA: Diagnosis not present

## 2021-11-10 DIAGNOSIS — I10 Essential (primary) hypertension: Secondary | ICD-10-CM | POA: Diagnosis not present

## 2021-11-10 DIAGNOSIS — G4733 Obstructive sleep apnea (adult) (pediatric): Secondary | ICD-10-CM | POA: Diagnosis not present

## 2021-11-10 DIAGNOSIS — E663 Overweight: Secondary | ICD-10-CM | POA: Diagnosis not present

## 2021-11-10 DIAGNOSIS — F332 Major depressive disorder, recurrent severe without psychotic features: Secondary | ICD-10-CM | POA: Diagnosis not present

## 2021-11-10 MED ORDER — WEGOVY 1.7 MG/0.75ML ~~LOC~~ SOAJ
1.7000 mg | SUBCUTANEOUS | 0 refills | Status: AC
  Filled 2021-11-10 – 2021-12-16 (×2): qty 3, 28d supply, fill #0

## 2021-11-10 MED ORDER — WEGOVY 2.4 MG/0.75ML ~~LOC~~ SOAJ
2.4000 mg | SUBCUTANEOUS | 0 refills | Status: DC
  Filled 2022-01-28: qty 3, 28d supply, fill #0

## 2021-11-10 MED ORDER — SERTRALINE HCL 50 MG PO TABS
75.0000 mg | ORAL_TABLET | Freq: Every day | ORAL | 0 refills | Status: DC
  Filled 2021-11-10: qty 135, 90d supply, fill #0

## 2021-11-12 ENCOUNTER — Other Ambulatory Visit (HOSPITAL_BASED_OUTPATIENT_CLINIC_OR_DEPARTMENT_OTHER): Payer: Self-pay

## 2021-11-12 MED ORDER — METHOCARBAMOL 500 MG PO TABS
500.0000 mg | ORAL_TABLET | Freq: Three times a day (TID) | ORAL | 1 refills | Status: AC
  Filled 2021-11-12: qty 90, 30d supply, fill #0
  Filled 2022-09-19: qty 90, 30d supply, fill #1

## 2021-11-25 DIAGNOSIS — E785 Hyperlipidemia, unspecified: Secondary | ICD-10-CM | POA: Diagnosis not present

## 2021-11-25 DIAGNOSIS — I10 Essential (primary) hypertension: Secondary | ICD-10-CM | POA: Diagnosis not present

## 2021-11-25 DIAGNOSIS — E559 Vitamin D deficiency, unspecified: Secondary | ICD-10-CM | POA: Diagnosis not present

## 2021-12-16 ENCOUNTER — Other Ambulatory Visit (HOSPITAL_BASED_OUTPATIENT_CLINIC_OR_DEPARTMENT_OTHER): Payer: Self-pay

## 2021-12-21 ENCOUNTER — Other Ambulatory Visit (HOSPITAL_BASED_OUTPATIENT_CLINIC_OR_DEPARTMENT_OTHER): Payer: Self-pay

## 2021-12-29 ENCOUNTER — Other Ambulatory Visit (HOSPITAL_BASED_OUTPATIENT_CLINIC_OR_DEPARTMENT_OTHER): Payer: Self-pay

## 2021-12-29 DIAGNOSIS — L729 Follicular cyst of the skin and subcutaneous tissue, unspecified: Secondary | ICD-10-CM | POA: Diagnosis not present

## 2021-12-29 MED ORDER — DOXYCYCLINE HYCLATE 100 MG PO CAPS
100.0000 mg | ORAL_CAPSULE | Freq: Two times a day (BID) | ORAL | 0 refills | Status: DC
  Filled 2021-12-29: qty 14, 7d supply, fill #0

## 2022-01-03 ENCOUNTER — Other Ambulatory Visit (HOSPITAL_BASED_OUTPATIENT_CLINIC_OR_DEPARTMENT_OTHER): Payer: Self-pay

## 2022-01-03 DIAGNOSIS — L729 Follicular cyst of the skin and subcutaneous tissue, unspecified: Secondary | ICD-10-CM | POA: Diagnosis not present

## 2022-01-03 DIAGNOSIS — R300 Dysuria: Secondary | ICD-10-CM | POA: Diagnosis not present

## 2022-01-03 MED ORDER — CEPHALEXIN 500 MG PO CAPS
500.0000 mg | ORAL_CAPSULE | Freq: Two times a day (BID) | ORAL | 0 refills | Status: AC
  Filled 2022-01-03: qty 14, 7d supply, fill #0

## 2022-01-11 DIAGNOSIS — R300 Dysuria: Secondary | ICD-10-CM | POA: Diagnosis not present

## 2022-01-11 DIAGNOSIS — L729 Follicular cyst of the skin and subcutaneous tissue, unspecified: Secondary | ICD-10-CM | POA: Diagnosis not present

## 2022-01-28 ENCOUNTER — Other Ambulatory Visit: Payer: Self-pay | Admitting: Cardiovascular Disease

## 2022-01-28 ENCOUNTER — Other Ambulatory Visit (HOSPITAL_BASED_OUTPATIENT_CLINIC_OR_DEPARTMENT_OTHER): Payer: Self-pay

## 2022-01-28 MED ORDER — IRBESARTAN 300 MG PO TABS
300.0000 mg | ORAL_TABLET | Freq: Every day | ORAL | 3 refills | Status: AC
  Filled 2022-01-28: qty 90, 90d supply, fill #0
  Filled 2022-04-29: qty 90, 90d supply, fill #1
  Filled 2022-12-29: qty 90, 90d supply, fill #2

## 2022-01-31 ENCOUNTER — Other Ambulatory Visit (HOSPITAL_BASED_OUTPATIENT_CLINIC_OR_DEPARTMENT_OTHER): Payer: Self-pay

## 2022-01-31 MED ORDER — ALPRAZOLAM 0.5 MG PO TABS
0.5000 mg | ORAL_TABLET | Freq: Every day | ORAL | 0 refills | Status: DC | PRN
  Filled 2022-01-31: qty 30, 30d supply, fill #0

## 2022-01-31 MED ORDER — AMPHETAMINE-DEXTROAMPHETAMINE 5 MG PO TABS
5.0000 mg | ORAL_TABLET | Freq: Two times a day (BID) | ORAL | 0 refills | Status: AC
  Filled 2022-01-31: qty 50, 25d supply, fill #0
  Filled 2022-01-31: qty 10, 5d supply, fill #0

## 2022-01-31 MED ORDER — EPINEPHRINE 0.3 MG/0.3ML IJ SOAJ
INTRAMUSCULAR | 0 refills | Status: DC
  Filled 2022-01-31: qty 2, 2d supply, fill #0

## 2022-02-01 ENCOUNTER — Other Ambulatory Visit (HOSPITAL_BASED_OUTPATIENT_CLINIC_OR_DEPARTMENT_OTHER): Payer: Self-pay

## 2022-02-01 DIAGNOSIS — Z79899 Other long term (current) drug therapy: Secondary | ICD-10-CM | POA: Diagnosis not present

## 2022-02-01 DIAGNOSIS — I10 Essential (primary) hypertension: Secondary | ICD-10-CM | POA: Diagnosis not present

## 2022-02-01 DIAGNOSIS — E663 Overweight: Secondary | ICD-10-CM | POA: Diagnosis not present

## 2022-02-01 DIAGNOSIS — E876 Hypokalemia: Secondary | ICD-10-CM | POA: Diagnosis not present

## 2022-02-01 DIAGNOSIS — E559 Vitamin D deficiency, unspecified: Secondary | ICD-10-CM | POA: Diagnosis not present

## 2022-02-01 DIAGNOSIS — E039 Hypothyroidism, unspecified: Secondary | ICD-10-CM | POA: Diagnosis not present

## 2022-02-01 MED ORDER — LIOTHYRONINE SODIUM 5 MCG PO TABS
5.0000 ug | ORAL_TABLET | Freq: Two times a day (BID) | ORAL | 0 refills | Status: AC
  Filled 2022-02-01: qty 60, 30d supply, fill #0

## 2022-02-02 ENCOUNTER — Other Ambulatory Visit (HOSPITAL_BASED_OUTPATIENT_CLINIC_OR_DEPARTMENT_OTHER): Payer: Self-pay

## 2022-02-23 DIAGNOSIS — E876 Hypokalemia: Secondary | ICD-10-CM | POA: Diagnosis not present

## 2022-03-02 ENCOUNTER — Other Ambulatory Visit (HOSPITAL_BASED_OUTPATIENT_CLINIC_OR_DEPARTMENT_OTHER): Payer: Self-pay

## 2022-03-10 ENCOUNTER — Other Ambulatory Visit (HOSPITAL_BASED_OUTPATIENT_CLINIC_OR_DEPARTMENT_OTHER): Payer: Self-pay

## 2022-03-10 ENCOUNTER — Encounter: Payer: Self-pay | Admitting: Cardiovascular Disease

## 2022-03-10 ENCOUNTER — Ambulatory Visit: Payer: Commercial Managed Care - PPO | Attending: Cardiovascular Disease | Admitting: Cardiovascular Disease

## 2022-03-10 VITALS — BP 110/58 | HR 91 | Ht 64.5 in | Wt 139.4 lb

## 2022-03-10 DIAGNOSIS — Z79899 Other long term (current) drug therapy: Secondary | ICD-10-CM | POA: Diagnosis not present

## 2022-03-10 DIAGNOSIS — I341 Nonrheumatic mitral (valve) prolapse: Secondary | ICD-10-CM

## 2022-03-10 DIAGNOSIS — Z13220 Encounter for screening for lipoid disorders: Secondary | ICD-10-CM | POA: Diagnosis not present

## 2022-03-10 DIAGNOSIS — I10 Essential (primary) hypertension: Secondary | ICD-10-CM | POA: Diagnosis not present

## 2022-03-10 DIAGNOSIS — I77819 Aortic ectasia, unspecified site: Secondary | ICD-10-CM

## 2022-03-10 DIAGNOSIS — I351 Nonrheumatic aortic (valve) insufficiency: Secondary | ICD-10-CM | POA: Diagnosis not present

## 2022-03-10 DIAGNOSIS — F32A Depression, unspecified: Secondary | ICD-10-CM

## 2022-03-10 DIAGNOSIS — G4731 Primary central sleep apnea: Secondary | ICD-10-CM | POA: Diagnosis not present

## 2022-03-10 NOTE — Progress Notes (Signed)
Cardiology Office Note    Date:  03/11/2022   ID:  Bethany Baker, DOB [birth year], MRN [id number]  PCP:  Bethany GrippeKim, James, MD  Cardiologist:  Bethany Guadalajarahomas Blayn Whetsell, MD   18 month F/U cardiology/sleep evaluation  History of Present Illness:  Bethany Baker is a 57 y.o. female who was referred through the courtesy of Dr. Pearson GrippeJames Kim for evaluation of aortic insufficiency. I last saw her on August 14, 2020.  She presents for follow-up evaluation.  Bethany Baker reportedly has a history of mitral valve prolapse and aortic regurgitation, and in the past was felt to possibly have a bicuspid aortic valve.  In January 2001, she underwent a nuclear perfusion study after she developed complaints of chest pain radiating to her left arm with mild shortness of breath.  This revealed normal perfusion without scar or ischemia.  An ejection fraction of 64%.  She had remotely seen Dr. Jenne Baker.  She underwent a carotid duplex study in March 2005 which was mildly abnormal.  At the origin of the left carotid and left subclavian.  The aorta measured 4 cm.  There was no significant diameter reduction in the carotid arteries.  She had undergone an echo Doppler study in June 2013 which showed an EF of 60-65% with grade 1 diastolic dysfunction.  There was moderate central aortic insufficiency with a posteriorly directed jet that impaired excursion of the anterior mitral valve leaflet.  Her mitral valve had a parachute appearance with late systolic prolapse in the anterior mitral leaflet reduced leaflet excursion.  The peak gradient was 10 mm.  She has not had recent follow-up of her valvular pathology.  She had recently seen Dr. Pearson GrippeJames Kim.  He did appreciated a progressive aortic insufficiency murmur.  She had noticed some shortness of breath with activity and denies palpitations.  .  When I initially saw her, she told me that she was originally diagnosed of as having obstructive sleep apnea and had undergone an initial sleep study in 2005  which showed an AHI of 11 per hour.  She apparently underwent a CPAP titration in 2011 and was titrated up to 16 cm water pressure.  She had not used CPAP in over 5 years.  She currently admits to nonrestorative sleep, fatigue, and was waking upt 2-3 times per night with nocturia.   She was unaware of any nocturnal palpitations.    She underwent an echo Doppler study in 10/14/2016 which showed normal systolic function with an EF of 55-60%.  There was grade 1 diastolic dysfunction.  There was a thickened aortic valve with mild-to-moderate aortic insufficiency, mild prolapse of anterior mitral valve leaflet with trace MR, and her ascending aorta was found to be dilated at 4.6 cm.  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  Carotid duplex imaging showed soft plaque in the right bifurcation with 1-39% stenosis.  There was no dilation at the origin of the left carotid artery and subclavian artery measuring 3.0 x 3.0 cm.  She had normal subclavian arteries bilaterally, and patent vertebral arteries with antegrade flow.  She  underwent a F/U sleep study on 11/29/2016.  Overall AHI was 2.9, but her RDI was 11.5, which was similar to her remote AHI of years ago.  During REM sleep her AHI was 12.6/h.  Although she felt that she did not sleep well that night, she had 82.7% sleep efficiency.  Wake after sleep onset was only  47.1 minutes.  REM latency was 75 minutes.   In light of her ascending aneurysm.  I recommended institution of irbesartan 150 mg daily, which be hopeful both for blood pressure control as well as her diastolic dysfunction.  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  At present, there is no indication for surgery at this time, but she will require close follow-up.  Her CTA of her chest.  We repeated in 6 months to establish stability and then  to decide on longer-term surveillance.  Surgery would not be recommended until the aneurysm was 5.5 cm, or there was a period of rapid enlargement of 5 mm over a year.  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  Typically she works the night shift, usually from 8 PM to 8 AM but at times has to work additional shifts commencing at 6 PM.  The days that she is not working she is switching her sleep cycle and as result, has not established a good circadian rhythm. .   When saw her in March 2021 after not having seen her over the previous 2 years she continued to be followed by her rheumatologist for mixed connective tissue disease.  She continues to work on Friday Saturday and Sunday evening shift from 8 PM to 8 AM.  Most of the time she is not working she typically is sleeping and essentially sleeps all of Monday.  She admits to loud snoring.  She has recently noticed some shortness of breath if she carries heavy boxes.  In addition she has noticed recent blood pressure elevation with blood pressures at home typically running in the 145-155 range.  I was concerned that she had obstructive sleep apnea and recommended she undergo definitive sleep study.  Ms. Bethany Baker underwent a follow-up split-night sleep study on June 10, 2019.  She was found to have mild to moderate overall sleep apnea on the diagnostic portion of the study with an AHI of 14.4 and RDI of 21.4/h; however events were moderately severe during REM sleep with an AHI of 29.3/h.  CPAP was initiated at 5 cm and was titrated to 18 cm of water pressure but AHI remained elevated at 15.6.  Due to continued events BiPAP was initiated and  was titrated up to 20/16.  It was recommended that she initiate BiPAP with a minimum EPAP pressure of 15 with pressure support of 4 and maximum IPAP pressure of 25.  Her BiPAP set up date was May 17, 2019 with [facility] as her DME company.    I saw her for  follow-up on October 28, 2019.  A download was obtained in the office from July 31 through October 27, 2019 which shows that she is meeting compliance standards with excellent usage.  Average usage is 6 hours and 33 minutes.  Her 95th percentile pressure is 19. 5/15.5.  AHI is 9.2 with an apnea index of 8.8 and central index 8. Since initiating BiPAP she is sleeping better.  Unfortunately her alteration of night and day sleep has not allowed her to get into a normal sleep pattern.  She often sleeps from noon until 6 PM.  If she is not working at night then she sleeps at night.  A new Epworth Sleepiness Scale score was calculated in the office today and this endorsed at 7 arguing against residual  daytime sleepiness.  I recommended slight adjustment of her pressure support up to 5 cm in attempt to see if this improved her AHI as well as central events.  In addition her blood pressure was elevated despite being on irbesartan 300 mg daily and metoprolol succinate 25 mg.  I recommended she increase metoprolol succinate to 50 mg.  She underwent a 2D echo Doppler study on March 03, 2020 which showed normal EF at 60 to 65%.  The echo now suggested severe aortic regurgitation.  Was no aortic stenosis.  There was moderate dilation of the ascending aorta measuring 46 mm.  This morning, underwent a follow-up CT angio of her chest and aorta on March 04, 2020  showed stable dilation of the ascending aorta at 4.5 cm.  She had seen Dr. Laneta Simmers on March 04, 2020 who felt that she did not meet criteria for aortic valve replacement by the 2020 [facility] guideline but requires close follow-up and it was recommended that she have another echocardiogram in 6 months to reevaluate her left ventricular function and internal dimensions.  I last saw her in the office on March 23, 2020.  A new download was obtained in the office  regarding her sleep apnea.  Showed reduced compliance with only 4 hours and 2 minutes of average use and usage days  and only 12 of the last 30 days or 40%.  She continues to have issues with alternating day and night shifts as result is not sleeping well.  On the days that she works evenings from 8 PM until 8 AM she often goes home in the morning, eats, gets to bed at 1:00 in the afternoon and wakes up at 6:30.  Her most recent download with pressures with a minimum EPAP of 14 and maximum IPAP of 25 shows never get AHI at 25.0 as well as significant central index of 24.4.  During that evaluation with her significant central events on BiPAP and normal LV function I recommended that she undergo an ASV titration and recommended that her follow-up echo Doppler study be done in 6 months.  Bethany Baker underwent her ASV titration on May 05, 2020.  It was recommended that she initiate ASV with an EPAP min of 5 and max at 8 cm of water, with a pressure support min of 3 and maximum of 15 cm of water with a auto breath rate.  Her ASV set up date was Jul 08, 2020.   I last saw her in the office on August 14, 2020.  I obtained a download from May 18 through August 13, 2020.  Compliance is excellent but she is only sleeping about 6 hours and 28 minutes with her ASV.  AHI is excellent at 0.4.  Her 95th percentile pressure was 14.2/7.1 with a maximum average pressure of 17.6/7.6.  There is no leak.  She feels significantly improved since initiating ASV.  She was scheduled for echo Doppler study on September 09, 2020.   Her echo on September 09, 2020 showed severe aortic insufficiency with AI pressure half-time of 241 ms.  There was mild diastolic flow reversal in the aortic arch consistent with her severe AI.  EF was normal and LV internal dimension was 5.2 cm during diastole and 3.3 cm to her asystole.  Her ascending aorta was measured at 47 mm.  She underwent repeat chest CTA on March 09, 2021 which showed the maximum diameter of her descending aortic aneurysm at 4.6.  She was seen by Dr. Laneta Simmers  on March 18, 2021.  She was felt to have stage D  severe symptomatic aortic insufficiency with class II symptoms of exertional fatigue and shortness of breath consistent with diastolic congestive heart failure.  There was a 4.6 cm fusiform ascending aortic aneurysm extending up to the proximal aortic arch which seem to begin at the level of the sinotubular junction.  At that time it was recommended by Dr. Laneta Simmers that she proceed with surgical treatment to prevent progressive left ventricular dysfunction.  Apparently, Bethany Baker never followed up with Dr. Laneta Simmers since that evaluation.  She has been on Wegovy and has lost 37 pounds since initiation.  She denies chest pain or shortness of breath.  She stopped using CPAP therapy in August 2022 following knee replacement.  Upon coming to the office today she had taken her elder all which he typically drinks 5 mg tablet once a day.  She has been on irbesartan 300 mg.  She is on alprazolam as needed in addition to Prozac 40 mg daily.  She is on Plaquenil 200 mg daily and levothyroxine 50 mcg in the morning and extra 25 mcg on Sunday.  She presents for evaluation.   Past Medical History:  Diagnosis Date   Aortic insufficiency    Depression    Fibromyalgia    MR (mitral regurgitation)    MVP (mitral valve prolapse)    OSA (obstructive sleep apnea) 06/10/2019   Systolic murmur    Thyroid disease    HYPOTHYROIDISM    Past Surgical History:  Procedure Laterality Date   TRANSESOPHAGEAL ECHOCARDIOGRAM      Current Medications: Outpatient Medications Prior to Visit  Medication Sig Dispense Refill   albuterol (VENTOLIN HFA) 108 (90 Base) MCG/ACT inhaler INHALE 2 PUFFS BY MOUTH EVERY 4 HOURS AS NEEDED FOR SHORTNESS OF BREATH OR WHEEZING 18 g 1   ALPRAZolam (XANAX) 0.5 MG tablet Take 0.5 mg by mouth at bedtime as needed for anxiety.     ALPRAZolam (XANAX) 0.5 MG tablet Take 1 tablet (0.5 mg total) by mouth daily as needed. 30 tablet 0   amphetamine-dextroamphetamine (ADDERALL) 5 MG tablet Take 1 tablet (5  mg total) by mouth 2 (two) times daily. 60 tablet 0   diclofenac Sodium (VOLTAREN) 1 % GEL 4 grams     doxycycline (VIBRAMYCIN) 100 MG capsule Take 1 capsule (100 mg total) by mouth 2 (two) times daily for 7 days 14 capsule 0   EPINEPHrine (EPIPEN 2-PAK) 0.3 mg/0.3 mL IJ SOAJ injection Use as needed and as directed for allergic reaction 2 each 0   esomeprazole (NEXIUM) 20 MG capsule Take 20 mg by mouth daily at 12 noon.     hydroxychloroquine (PLAQUENIL) 200 MG tablet 2 tablets     irbesartan (AVAPRO) 300 MG tablet Take 1 tablet (300 mg total) by mouth daily. 90 tablet 3   lidocaine (LIDODERM) 5 % 1 patch to intact skin remove after 12 hours     liothyronine (CYTOMEL) 5 MCG tablet Take 1 tablet (5 mcg total) by mouth 2 (two) times daily before a meal. 60 tablet 0   loratadine (CLARITIN) 10 MG tablet Take 10 mg by mouth daily.     methocarbamol (ROBAXIN) 500 MG tablet Take 1 tablet (500 mg total) by mouth every 8 (eight) hours. 90 tablet 1   metoprolol succinate (TOPROL-XL) 50 MG 24 hr tablet Take 1 tablet (50 mg total) by mouth daily. 90 tablet 3   montelukast (SINGULAIR) 10 MG tablet 1 tablet in the  evening     Multiple Vitamin (MULTIVITAMIN) tablet Take 1 tablet by mouth daily.     sertraline (ZOLOFT) 50 MG tablet Take 1.5 tablets (75 mg total) by mouth daily. 135 tablet 0   Semaglutide-Weight Management (WEGOVY) 2.4 MG/0.75ML SOAJ Inject 2.4 mg into the skin once a week. 3 mL 0   amphetamine-dextroamphetamine (ADDERALL) 15 MG tablet Take by mouth daily. Pt takes 0.5 tablet (Patient not taking: Reported on 03/10/2022)     amphetamine-dextroamphetamine (ADDERALL) 5 MG tablet Take 1 tablet by mouth twice a day (Patient not taking: Reported on 03/10/2022) 60 tablet 0   FLUoxetine (PROZAC) 20 MG capsule Take 1 capsule by mouth once daily for 14 days 14 capsule 0   FLUoxetine (PROZAC) 40 MG capsule take 1 capsule by mouth every morning 90 capsule 1   levothyroxine (SYNTHROID) 50 MCG tablet TAKE ONE  TABLET BY MOUTH ON AN EMPTY STOMACH DAILY IN THE MORNING AND AN EXTRA 1/2 TABLET ON SUNDAYS 90 tablet 1   liothyronine (CYTOMEL) 5 MCG tablet TAKE ONE TABLET BY MOUTH TWICE DAILY ON AN EMPTY STOMACH 180 tablet 1   pantoprazole (PROTONIX) 40 MG tablet TAKE ONE TABLET BY MOUTH ONCE DAILY 90 tablet 3   Semaglutide-Weight Management (WEGOVY) 0.25 MG/0.5ML SOAJ Inject 0.25mg  under the skin once weekly (Patient not taking: Reported on 03/10/2022) 2 mL 0   Semaglutide-Weight Management (WEGOVY) 0.5 MG/0.5ML SOAJ Inject 0.5mg  under the skin once weekly (Patient not taking: Reported on 03/10/2022) 2 mL 0   Semaglutide-Weight Management (WEGOVY) 1 MG/0.5ML SOAJ Inject 1mg  under the skin once weekly (Patient not taking: Reported on 03/10/2022) 2 mL 0   Semaglutide-Weight Management (WEGOVY) 1.7 MG/0.75ML SOAJ Inject 1.7 mg into the skin once a week. (Patient not taking: Reported on 03/10/2022) 3 mL 0   No facility-administered medications prior to visit.     Allergies:   Acetaminophen, Adhesive [tape], Aspirin, Effexor [venlafaxine], Eggs or egg-derived products, Ibuprofen, Mercury, Wellbutrin [bupropion], and Atenolol   Social History   Socioeconomic History   Marital status: Single    Spouse name: Not on file   Number of children: Not on file   Years of education: Not on file   Highest education level: Not on file  Occupational History   Not on file  Tobacco Use   Smoking status: Never   Smokeless tobacco: Never  Substance and Sexual Activity   Alcohol use: No   Drug use: No   Sexual activity: Not on file  Other Topics Concern   Not on file  Social History Narrative   Not on file   Social Determinants of Health   Financial Resource Strain: Not on file  Food Insecurity: Not on file  Transportation Needs: Not on file  Physical Activity: Not on file  Stress: Not on file  Social Connections: Not on file     Family History:  The patient's family history includes Cancer in her mother;  Fibromyalgia in her mother; Heart failure in her father; Hypertension in her brother and father; Mitral valve prolapse in her mother.   ROS General: Negative; No fevers, chills, or night sweats;  HEENT: Negative; No changes in vision or hearing, sinus congestion, difficulty swallowing Pulmonary: Negative; No cough, wheezing, shortness of breath, hemoptysis Cardiovascular: see HPI GI: Negative; No nausea, vomiting, diarrhea, or abdominal pain GU: Negative; No dysuria, hematuria, or difficulty voiding Musculoskeletal: Occasional leg cramps c Hematologic/Oncology: Negative; no easy bruising, bleeding Endocrine: Negative; no heat/cold intolerance; no diabetes Neuro: Negative; no changes  in balance, headaches Skin: Negative; No rashes or skin lesions Psychiatric: Negative; No behavioral problems, depression Sleep: Positive for  sleep apnea with continued central events on BiPAP, necessitating ASV titration; , fatigue, nonrestorative sleep, nocturia and snoring; no bruxism, restless legs, hypnogognic hallucinations, no cataplexy Other comprehensive 14 point system review is negative.   PHYSICAL EXAM:   VS:  BP (!) 110/58 (BP Location: Left Arm, Patient Position: Sitting, Cuff Size: Normal)   Pulse 91   Ht 5' 4.5" (1.638 m)   Wt 139 lb 6.4 oz (63.2 kg)   SpO2 94%   BMI 23.56 kg/m     Repeat blood pressure by me was 122/70.  Heart rate was increased in the 90s  Wt Readings from Last 3 Encounters:  03/10/22 139 lb 6.4 oz (63.2 kg)  03/18/21 175 lb (79.4 kg)  03/09/21 175 lb (79.4 kg)   General: Alert, oriented, no distress.  Skin: normal turgor, no rashes, warm and dry HEENT: Normocephalic, atraumatic. Pupils equal round and reactive to light; sclera anicteric; extraocular muscles intact;  Nose without nasal septal hypertrophy Mouth/Parynx benign; Mallinpatti scale 3 Neck: No JVD, no carotid bruits; normal carotid upstroke Lungs: clear to ausculatation and percussion; no wheezing or  rales Chest wall: without tenderness to palpitation Heart: PMI not displaced, RRR, s1 s2 normal, 1-2/6 systolic murmur, 2/6 aortic insufficiency diastolic murmur, no rubs, gallops, thrills, or heaves Abdomen: soft, nontender; no hepatosplenomehaly, BS+; abdominal aorta nontender and not dilated by palpation. Back: no CVA tenderness Pulses 2+ Musculoskeletal: full range of motion, normal strength, no joint deformities Extremities: no clubbing cyanosis or edema, Homan's sign negative  Neurologic: grossly nonfocal; Cranial nerves grossly wnl Psychologic: Normal mood and affect   Studies/Labs Reviewed:   March 10, 2022 ECG (independently read by me): NSR at 91, LAE, Nonspecific T wave abnormality  August 14, 2020 ECG (independently read by me): Normal sinus rhythm at 63 bpm, LVH by voltage.  Nonspecific T wave abnormality.  Normal intervals, no ectopy.  August 2021 ECG (independently read by me): Normal sinus rhythm 86 bpm, probable left atrial enlargement.  LVH by voltage.  Nonspecific ST changes.  QTc interval 466 ms.  March 2021ECG (independently read by me): Normal sinus rhythm at 87 bpm, probable left atrial enlargement, borderline LVH by voltage.  Nonspecific T wave abnormality.  QTc interval 454 ms  January 2019 ECG (independently read by me): Normal sinus rhythm 84 bpm.  LVH by voltage criteria in aVL.  Nonspecific T changes.  October 2018 ECG (independently read by me): Normal sinus rhythm at 85 bpm.  Left axis deviation.  LVH by voltage criteria.  August 2018 ECG (independently read by me): Normal sinus rhythm at 85 bpm.  Borderline voltage for LVH.  T-wave abnormality in lead 3 and aVF.  Recent Labs:    Latest Ref Rng & Units 03/11/2022    9:01 AM 10/20/2008   10:56 AM 09/07/2007    9:30 PM  BMP  Glucose 70 - 99 mg/dL 96  86  81   BUN 6 - 24 mg/dL 12  9  10    Creatinine 0.57 - 1.00 mg/dL 1.61  0.96  0.45   BUN/Creat Ratio 9 - 23 15     Sodium 134 - 144 mmol/L 141  138  139    Potassium 3.5 - 5.2 mmol/L 3.8  3.5  3.7   Chloride 96 - 106 mmol/L 102  103  103   CO2 20 - 29 mmol/L 27  29  23   Calcium 8.7 - 10.2 mg/dL 9.6  8.7  8.9         Latest Ref Rng & Units 03/11/2022    9:01 AM 10/20/2008   10:56 AM 09/07/2007    9:30 PM  Hepatic Function  Total Protein 6.0 - 8.5 g/dL 6.7  6.8  6.9   Albumin 3.8 - 4.9 g/dL 4.6  3.9  4.5   AST 0 - 40 IU/L ALT 0 - 32 IU/L Alk Phosphatase 44 - 121 IU/L 86  74  61   Total Bilirubin 0.0 - 1.2 mg/dL 0.7  0.6  0.5   Bilirubin, Direct 0.0 - 0.3 mg/dL   0.1        1/61/0960    9:01 AM 10/20/2008   10:56 AM 12/17/2007   11:20 AM  CBC  WBC WILL FOLLOW  P 5.7  5.3   Hemoglobin WILL FOLLOW  P 12.0  12.4   Hematocrit WILL FOLLOW  P 36.1  35.9   Platelets WILL FOLLOW  P 227  220     P Preliminary result   Lab Results  Component Value Date   MCV WILL FOLLOW 03/11/2022   MCV 89.8 10/20/2008   MCV 99.6 12/17/2007   Lab Results  Component Value Date   TSH 2.710 03/11/2022   No results found for: "HGBA1C"   BNP No results found for: "BNP"  ProBNP No results found for: "PROBNP"   Lipid Panel     Component Value Date/Time   CHOL 214 (H) 03/11/2022 0901   TRIG 101 03/11/2022 0901   HDL 57 03/11/2022 0901   CHOLHDL 3.8 03/11/2022 0901   CHOLHDL 2.6 Ratio 09/07/2007 2130   VLDL 16 09/07/2007 2130   LDLCALC 139 (H) 03/11/2022 0901     RADIOLOGY: No results found.    Additional studies/ records that were reviewed today include:  I reviewed the  Remote nuclear perfusion study, carotid duplex study, and the patient's last 2-D echo Doppler study from 2013.  I also reviewed her prior sleep evaluations and last CPAP titration trial of 2011.  I reviewed the records from Dr. Laneta Simmers , as well as her CT angiogram,  echo Doppler data. A new Epworth Sleepiness Scale score was calculated in the office today   ECHO 02/28/2019 IMPRESSIONS   1. Left ventricular ejection fraction, by visual  estimation, is 60 to  65%. The left ventricle has normal function. There is no left ventricular  hypertrophy.   2. Left ventricular diastolic parameters are consistent with Grade I  diastolic dysfunction (impaired relaxation).   3. The left ventricle has no regional wall motion abnormalities.   4. Global right ventricle has normal systolic function.The right  ventricular size is normal. No increase in right ventricular wall  thickness.   5. Left atrial size was normal.   6. Right atrial size was normal.   7. Trivial pericardial effusion is present.   8. The mitral valve is normal in structure. Trivial mitral valve  regurgitation. No evidence of mitral stenosis.   9. The tricuspid valve is normal in structure.  10. Aortic valve regurgitation is moderate.  11. The aortic valve is normal in structure. Aortic valve regurgitation is  moderate. Mild aortic valve stenosis.  12. The pulmonic valve was normal in structure. Pulmonic valve  regurgitation is trivial.  13. Aortic dilatation noted.  14. There is dilatation of the ascending aorta  measuring 46 mm.  15. Normal pulmonary artery systolic pressure.  16. The inferior vena cava is normal in size with greater than 50%  respiratory variability, suggesting right atrial pressure of 3 mmHg.   ECHO: 03/03/2020 IMPRESSIONS   1. Left ventricular ejection fraction, by estimation, is 60 to 65%. The  left ventricle has normal function. The left ventricle has no regional  wall motion abnormalities. The left ventricular internal cavity size was  mildly dilated. Left ventricular  diastolic parameters are consistent with Grade I diastolic dysfunction  (impaired relaxation).   2. Right ventricular systolic function is normal. The right ventricular  size is normal.   3. The mitral valve is normal in structure. Trivial mitral valve  regurgitation. No evidence of mitral stenosis.   4. The aortic valve has an indeterminant number of cusps. Aortic valve   regurgitation is severe. No aortic stenosis is present.   5. Aortic dilatation noted. There is moderate dilatation of the ascending  aorta, measuring 46 mm.   6. The inferior vena cava is normal in size with greater than 50%  respiratory variability, suggesting right atrial pressure of 3 mmHg.   Comparison(s): 02/28/19 EF 60-65%. Mild AS 14 mmHg mean PG, 20 mmHg peak  PG. Ascending aortic dimension 46 mm.    ASV TITRATION: 05/05/2020 CLINICAL INFORMATION The patient is referred for a adaptive servo-ventilator titration study. Most recent polysomnogram dated 06/10/2019 revealed an AHI of 14.4/h and RDI of 21.4/h. Most recent titration study dated 06/10/2019 revealed an AHI of 6.4/h. On BiPAP she has continued to have significant obstructive apnea and central apnea events.   MEDICATIONS albuterol (PROVENTIL HFA;VENTOLIN HFA) 108 (90 Base) MCG/ACT inhaler ALPRAZolam (XANAX) 0.5 MG tablet amphetamine-dextroamphetamine (ADDERALL) 15 MG tablet esomeprazole (NEXIUM) 20 MG capsule FLUoxetine (PROZAC) 40 MG capsule irbesartan (AVAPRO) 300 MG tablet ketorolac (TORADOL) 10 MG tablet levothyroxine (SYNTHROID, LEVOTHROID) 50 MCG tablet lidocaine (LIDODERM) 5 % liothyronine (CYTOMEL) 5 MCG tablet loratadine (CLARITIN) 10 MG tablet methocarbamol (ROBAXIN) 500 MG tablet metoprolol succinate (TOPROL-XL) 50 MG 24 hr tablet montelukast (SINGULAIR) 10 MG tablet Multiple Vitamin (MULTIVITAMIN) tablet traMADol (ULTRAM) 50 MG tablet Medications self-administered by patient taken the night of the study : [place] As per the AASM Manual for the Scoring of Sleep and Associated Events v2.3 (April 2016) with a hypopnea requiring 4% desaturations.   The channels recorded and monitored were frontal, central and occipital EEG, electrooculogram (EOG), submentalis EMG (chin), nasal and oral airflow, thoracic and abdominal wall motion, anterior tibialis EMG, snore microphone,  electrocardiogram, and pulse oximetry.   RESPIRATORY PARAMETERS Optimal Min IPAP (cm):          5          Optimal Max IPAP (cm):         8          Optimal Min EPAP (cm):    5          Optimal Max EPAP (cm):        8 Optimal Max Pressure (cm):   15        Optimal Min PS (cm):  3          Optimal Max PS (cm): 15            Opitmal Breathing Rate (/min):           Auto Overall Min O2 (%):    89.0     Min O2 at Optimal Pressure (%):  89.0     AHI at Optimal (/hr):    N/A         SLEEP ARCHITECTURE During a recording time of 405.4 minutes, the patient slept for 275 minutes. Sleep efficiency was 67.8%%. The patient spent 9.6%% of the night in stage N1 sleep, 71.1%% in stage N2 sleep, 0.0%% in stage N3 and 19.3% in REM. Wake after sleep onset (WASO) was 89.7 minutes. Alpha intrusion was PRESENT ABSENT. Supine sleep was 3.99%. The arousal index was 10.0.   LEG MOVEMENT DATA PLM Index (/hr):          3.5       PLM Arousal Index (/hr):         0.0   CARDIAC DATA The 2 lead EKG demonstrated sinus rhythm. The mean heart rate was 60.5 beats per minute. Other EKG findings include: PACs.   IMPRESSIONS - No significant obstructive sleep apnea occurred during this study (AHI 0.9/hour). An optimal ASV pressure was selected. - Mild oxygen desaturation was noted during this study (Min O2 = 89.0). - The patient snored for % of sleep with moderate snoring volume. - EKG findings include PVCs. - Clinically significant periodic limb movements did not occur during sleep.   DIAGNOSIS - Obstructive Sleep Apnea (G47.33)   RECOMMENDATIONS - Trial of SV Advance EPAP Min 5 and Max 8 cmH2O, Pressure Support Min 3 and Max 15 cmH2O and Breath Rate of Auto BrPM - Effort should be made to optimize nasal and oropharyngeal patency. - Avoid alcohol, sedatives and other CNS depressants that may worsen sleep apnea and disrupt normal sleep architecture. - Sleep hygiene should be reviewed to assess factors that may  improve sleep quality. - Weight management and regular exercise should be initiated or continued. - Recommend a downoad in 30 days and sleep clinic evaluation after 4 weeks of therapy    ASSESSMENT:    1. Aortic valve insufficiency, etiology of cardiac valve disease unspecified   2. Acquired dilation of ascending aorta and aortic root (HCC)   3. Medication management   4. Essential hypertension   5. Screening for hyperlipidemia   6. MVP (mitral valve prolapse)   7. 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 Depression, unspecified depression type     PLAN:   Bethany Baker is a  57 year-old female who has a history of mitral valve prolapse and previously noted at least moderate aortic insufficiency.  She also has a history of sleep apnea and had been on CPAP therapy remotely and reinstituted CPAP therapy following a sleep study in October 2018.  However, since August 2022 she has been off CPAP therapy.  She has a history of obesity with peak weight at 177.  She has recently been on Wegovy and has had 37 pound weight loss with her most recent weight at 139.  When I initially saw her, she had a murmur of at least moderate aortic insufficiency as well as MR from mitral valve prolapse.  Over the years, follow-up evaluation demonstrated development of severe aortic regurgitation noted on September 09, 2020 echo with an AI pressure half-time of 241 ms.  There was mild diastolic flow reversal in the aortic arch.  Her ascending aorta was 47 mm.  Repeat CTA in January 2023 showed maximum diameter of the ascending aorta at 4.6 compared to a measurement previously at 4.5.  When last seen by Dr. Laneta Simmers in January 2023 he was concerned with her progression and felt that that time  it may be best to consider proceeding with surgical treatment to prevent progressive left ventricular dysfunction.  Apparently, she never followed up with this.  Presently, I am recommending she undergo follow-up echo Doppler study.   I will schedule her for repeat CT angio of her chest and aorta.  I have then recommended she see Dr. Laneta SimmersBartle in follow-up.  I have recommended resumption of CPAP therapy with her previous documentation of obstructive sleep apnea.  Laboratory will be checked in the fasting state with a comprehensive metabolic panel, CBC, TSH and lipid studies.  Resting heart rate is increased in the 90s which may be contributed by her elder role.  I have recommended institution of low-dose metoprolol succinate initially at 12.5 mg daily.  She will see Dr. Laneta SimmersBartle.  Her sleep is disruptive since she works for [employer] from 8 PM to 8 AM on Friday Saturday and Sunday nights but on the other nights her sleep pattern is completely different.  I will see her in several months for follow-up evaluation.    Medication Adjustments/Labs and Tests Ordered: Current medicines are reviewed at length with the patient today.  Concerns regarding medicines are outlined above.  Medication changes, Labs and Tests ordered today are listed in the Patient Instructions below. Patient Instructions  Medication Instructions:  Please resume taking your metoprolol 25mg  daily. (0.5 tablet)  Please resume using your Cpap machine  *If you need a refill on your cardiac medications before your next appointment, please call your pharmacy*    Please schedule an appointment with Dr. Laneta SimmersBartle after your ECHO and CT   Lab Work: Your physician recommends that you return at your earliest convenience to have the following labs drawn: CMET, CBC, TSH, and Lipids  If you have labs (blood work) drawn today and your tests are completely normal, you will receive your results only by: MyChart Message (if you have MyChart) OR A paper copy in the mail If you have any lab test that is abnormal or we need to change your treatment, we will call you to review the results.   Testing/Procedures: Your physician has requested that you have an echocardiogram.  Echocardiography is a painless test that uses sound waves to create images of your heart. It provides your doctor with information about the size and shape of your heart and how well your heart's chambers and valves are working. This procedure takes approximately one hour. There are no restrictions for this procedure. Please do NOT wear cologne, perfume, aftershave, or lotions (deodorant is allowed). Please arrive 15 minutes prior to your appointment time.  CTA Chest and Aorta. Someone will reach out to you to get this scheduled.     Follow-Up: At [facility], you and your health needs are our priority.  As part of our continuing mission to provide you with exceptional heart care, we have created designated Provider Care Teams.  These Care Teams include your primary Cardiologist (physician) and Advanced Practice Providers (APPs -  Physician Assistants and Nurse Practitioners) who all work together to provide you with the care you need, when you need it.  We recommend signing up for the patient portal called "MyChart".  Sign up information is provided on this After Visit Summary.  MyChart is used to connect with patients for Virtual Visits (Telemedicine).  Patients are able to view lab/test results, encounter notes, upcoming appointments, etc.  Non-urgent messages can be sent to your provider as well.   To learn more about what you  can do with MyChart, go to [email or web address].    Your next appointment:   6 month(s)  Provider:   Nicki Guadalajara, MD       Signed, Bethany Guadalajara, MD  03/11/2022 6:18 PM    [facility] [address], [address], [place], [place]  [address] Phone: [phone]

## 2022-03-10 NOTE — Patient Instructions (Addendum)
Medication Instructions:  Please resume taking your metoprolol 25mg  daily. (0.5 tablet)  Please resume using your Cpap machine  *If you need a refill on your cardiac medications before your next appointment, please call your pharmacy*    Please schedule an appointment with Dr. Cyndia Bent after your ECHO and CT   Lab Work: Your physician recommends that you return at your earliest convenience to have the following labs drawn: CMET, CBC, TSH, and Lipids  If you have labs (blood work) drawn today and your tests are completely normal, you will receive your results only by: [place] (if you have MyChart) OR A paper copy in the mail If you have any lab test that is abnormal or we need to change your treatment, we will call you to review the results.   Testing/Procedures: Your physician has requested that you have an echocardiogram. Echocardiography is a painless test that uses sound waves to create images of your heart. It provides your doctor with information about the size and shape of your heart and how well your heart's chambers and valves are working. This procedure takes approximately one hour. There are no restrictions for this procedure. Please do NOT wear cologne, perfume, aftershave, or lotions (deodorant is allowed). Please arrive 15 minutes prior to your appointment time.  CTA Chest and Aorta. Someone will reach out to you to get this scheduled.     Follow-Up: At [facility], you and your health needs are our priority.  As part of our continuing mission to provide you with exceptional heart care, we have created designated Provider Care Teams.  These Care Teams include your primary Cardiologist (physician) and Advanced Practice Providers (APPs -  Physician Assistants and Nurse Practitioners) who all work together to provide you with the care you need, when you need it.  We recommend signing up for the patient portal called "MyChart".  Sign up information is provided on  this After Visit Summary.  MyChart is used to connect with patients for Virtual Visits (Telemedicine).  Patients are able to view lab/test results, encounter notes, upcoming appointments, etc.  Non-urgent messages can be sent to your provider as well.   To learn more about what you can do with MyChart, go to [email or web address].    Your next appointment:   6 month(s)  Provider:   Shelva Majestic, MD

## 2022-03-11 ENCOUNTER — Encounter: Payer: Self-pay | Admitting: Cardiovascular Disease

## 2022-03-11 ENCOUNTER — Other Ambulatory Visit: Payer: Self-pay

## 2022-03-11 ENCOUNTER — Other Ambulatory Visit (HOSPITAL_BASED_OUTPATIENT_CLINIC_OR_DEPARTMENT_OTHER): Payer: Self-pay

## 2022-03-11 DIAGNOSIS — Z13220 Encounter for screening for lipoid disorders: Secondary | ICD-10-CM

## 2022-03-11 DIAGNOSIS — Z79899 Other long term (current) drug therapy: Secondary | ICD-10-CM

## 2022-03-11 LAB — CBC

## 2022-03-11 MED ORDER — WEGOVY 2.4 MG/0.75ML ~~LOC~~ SOAJ
2.4000 mg | SUBCUTANEOUS | 3 refills | Status: AC
  Filled 2022-03-11 – 2022-04-02 (×2): qty 3, 28d supply, fill #0
  Filled 2022-04-29: qty 3, 28d supply, fill #1

## 2022-03-12 LAB — COMPREHENSIVE METABOLIC PANEL
ALT: 12 IU/L (ref 0–32)
AST: 14 IU/L (ref 0–40)
Albumin/Globulin Ratio: 2.2 (ref 1.2–2.2)
Albumin: 4.6 g/dL (ref 3.8–4.9)
Alkaline Phosphatase: 86 IU/L (ref 44–121)
BUN/Creatinine Ratio: 15 (ref 9–23)
BUN: 12 mg/dL (ref 6–24)
Bilirubin Total: 0.7 mg/dL (ref 0.0–1.2)
CO2: 27 mmol/L (ref 20–29)
Calcium: 9.6 mg/dL (ref 8.7–10.2)
Chloride: 102 mmol/L (ref 96–106)
Creatinine, Ser: 0.82 mg/dL (ref 0.57–1.00)
Globulin, Total: 2.1 g/dL (ref 1.5–4.5)
Glucose: 96 mg/dL (ref 70–99)
Potassium: 3.8 mmol/L (ref 3.5–5.2)
Sodium: 141 mmol/L (ref 134–144)
Total Protein: 6.7 g/dL (ref 6.0–8.5)
eGFR: 84 mL/min/{1.73_m2} (ref >59)

## 2022-03-12 LAB — LIPID PANEL
Chol/HDL Ratio: 3.8 ratio (ref 0.0–4.4)
Cholesterol, Total: 214 mg/dL — ABNORMAL HIGH (ref 100–199)
HDL: 57 mg/dL (ref >39)
LDL Chol Calc (NIH): 139 mg/dL — ABNORMAL HIGH (ref 0–99)
Triglycerides: 101 mg/dL (ref 0–149)
VLDL Cholesterol Cal: 18 mg/dL (ref 5–40)

## 2022-03-12 LAB — TSH: TSH: 2.71 u[IU]/mL (ref 0.450–4.500)

## 2022-03-12 LAB — CBC
Hematocrit: 40.7 % (ref 34.0–46.6)
Hemoglobin: 14.4 g/dL (ref 11.1–15.9)
MCH: 36.5 pg — ABNORMAL HIGH (ref 26.6–33.0)
MCHC: 35.4 g/dL (ref 31.5–35.7)
MCV: 103 fL — ABNORMAL HIGH (ref 79–97)
Platelets: 192 10*3/uL (ref 150–450)
RBC: 3.94 x10E6/uL (ref 3.77–5.28)
RDW: 12.9 % (ref 11.7–15.4)
WBC: 6.7 10*3/uL (ref 3.4–10.8)

## 2022-03-14 ENCOUNTER — Other Ambulatory Visit (HOSPITAL_BASED_OUTPATIENT_CLINIC_OR_DEPARTMENT_OTHER): Payer: Self-pay

## 2022-03-14 ENCOUNTER — Encounter: Payer: Self-pay | Admitting: Cardiovascular Disease

## 2022-03-21 ENCOUNTER — Other Ambulatory Visit (HOSPITAL_BASED_OUTPATIENT_CLINIC_OR_DEPARTMENT_OTHER): Payer: Self-pay

## 2022-03-28 ENCOUNTER — Other Ambulatory Visit (HOSPITAL_BASED_OUTPATIENT_CLINIC_OR_DEPARTMENT_OTHER): Payer: Self-pay

## 2022-04-01 ENCOUNTER — Other Ambulatory Visit (HOSPITAL_BASED_OUTPATIENT_CLINIC_OR_DEPARTMENT_OTHER): Payer: Self-pay

## 2022-04-02 ENCOUNTER — Other Ambulatory Visit (HOSPITAL_BASED_OUTPATIENT_CLINIC_OR_DEPARTMENT_OTHER): Payer: Self-pay

## 2022-04-04 ENCOUNTER — Other Ambulatory Visit (HOSPITAL_BASED_OUTPATIENT_CLINIC_OR_DEPARTMENT_OTHER): Payer: Self-pay

## 2022-04-04 ENCOUNTER — Ambulatory Visit (HOSPITAL_COMMUNITY): Payer: 59 | Attending: Cardiovascular Disease

## 2022-04-04 DIAGNOSIS — I351 Nonrheumatic aortic (valve) insufficiency: Secondary | ICD-10-CM | POA: Insufficient documentation

## 2022-04-04 DIAGNOSIS — I77819 Aortic ectasia, unspecified site: Secondary | ICD-10-CM | POA: Diagnosis not present

## 2022-04-04 LAB — ECHOCARDIOGRAM COMPLETE
AR max vel: 1.74 cm2
AV Mean grad: 11 mmHg
AV Peak grad: 19.4 mmHg
Ao pk vel: 2.2 m/s
Area-P 1/2: 2.48 cm2
P 1/2 time: 428 msec
S' Lateral: 2.7 cm

## 2022-04-04 MED ORDER — SERTRALINE HCL 50 MG PO TABS
75.0000 mg | ORAL_TABLET | Freq: Every day | ORAL | 0 refills | Status: DC
  Filled 2022-04-04: qty 45, 30d supply, fill #0

## 2022-04-04 MED ORDER — ALPRAZOLAM 0.5 MG PO TABS
0.5000 mg | ORAL_TABLET | Freq: Every day | ORAL | 0 refills | Status: AC | PRN
  Filled 2022-04-04: qty 30, 30d supply, fill #0

## 2022-04-04 MED ORDER — LIOTHYRONINE SODIUM 5 MCG PO TABS
5.0000 ug | ORAL_TABLET | Freq: Two times a day (BID) | ORAL | 0 refills | Status: AC
  Filled 2022-04-04: qty 60, 30d supply, fill #0

## 2022-04-04 MED ORDER — AMPHETAMINE-DEXTROAMPHETAMINE 5 MG PO TABS
5.0000 mg | ORAL_TABLET | Freq: Two times a day (BID) | ORAL | 0 refills | Status: DC
  Filled 2022-04-04: qty 60, 30d supply, fill #0

## 2022-04-06 ENCOUNTER — Other Ambulatory Visit (HOSPITAL_BASED_OUTPATIENT_CLINIC_OR_DEPARTMENT_OTHER): Payer: Self-pay

## 2022-04-06 MED ORDER — LEVOTHYROXINE SODIUM 50 MCG PO TABS
50.0000 ug | ORAL_TABLET | Freq: Every morning | ORAL | 0 refills | Status: AC
  Filled 2022-04-06: qty 90, 90d supply, fill #0

## 2022-04-08 ENCOUNTER — Other Ambulatory Visit (HOSPITAL_BASED_OUTPATIENT_CLINIC_OR_DEPARTMENT_OTHER): Payer: Self-pay

## 2022-04-08 MED ORDER — AMPHETAMINE-DEXTROAMPHETAMINE 5 MG PO TABS
1.0000 | ORAL_TABLET | Freq: Two times a day (BID) | ORAL | 0 refills | Status: AC
  Filled 2022-04-08 – 2022-09-19 (×2): qty 60, 30d supply, fill #0

## 2022-04-18 ENCOUNTER — Other Ambulatory Visit (HOSPITAL_BASED_OUTPATIENT_CLINIC_OR_DEPARTMENT_OTHER): Payer: Self-pay

## 2022-04-22 ENCOUNTER — Other Ambulatory Visit (HOSPITAL_BASED_OUTPATIENT_CLINIC_OR_DEPARTMENT_OTHER): Payer: Self-pay

## 2022-05-02 ENCOUNTER — Other Ambulatory Visit (HOSPITAL_BASED_OUTPATIENT_CLINIC_OR_DEPARTMENT_OTHER): Payer: Self-pay

## 2022-05-23 ENCOUNTER — Other Ambulatory Visit (HOSPITAL_BASED_OUTPATIENT_CLINIC_OR_DEPARTMENT_OTHER): Payer: Self-pay

## 2022-05-23 MED ORDER — WEGOVY 1 MG/0.5ML ~~LOC~~ SOAJ
1.0000 mg | SUBCUTANEOUS | 0 refills | Status: DC
  Filled 2022-05-23 – 2022-06-22 (×3): qty 2, 28d supply, fill #0

## 2022-05-23 MED ORDER — SERTRALINE HCL 50 MG PO TABS
75.0000 mg | ORAL_TABLET | Freq: Every day | ORAL | 0 refills | Status: DC
  Filled 2022-05-23 – 2022-07-29 (×2): qty 45, 30d supply, fill #0

## 2022-05-30 ENCOUNTER — Other Ambulatory Visit (HOSPITAL_BASED_OUTPATIENT_CLINIC_OR_DEPARTMENT_OTHER): Payer: Self-pay

## 2022-05-30 ENCOUNTER — Other Ambulatory Visit: Payer: Self-pay

## 2022-06-01 ENCOUNTER — Other Ambulatory Visit (HOSPITAL_BASED_OUTPATIENT_CLINIC_OR_DEPARTMENT_OTHER): Payer: Self-pay

## 2022-06-06 ENCOUNTER — Other Ambulatory Visit (HOSPITAL_BASED_OUTPATIENT_CLINIC_OR_DEPARTMENT_OTHER): Payer: Self-pay

## 2022-06-13 ENCOUNTER — Other Ambulatory Visit (HOSPITAL_BASED_OUTPATIENT_CLINIC_OR_DEPARTMENT_OTHER): Payer: Self-pay

## 2022-06-23 ENCOUNTER — Other Ambulatory Visit (HOSPITAL_BASED_OUTPATIENT_CLINIC_OR_DEPARTMENT_OTHER): Payer: Self-pay

## 2022-07-29 ENCOUNTER — Other Ambulatory Visit (HOSPITAL_BASED_OUTPATIENT_CLINIC_OR_DEPARTMENT_OTHER): Payer: Self-pay

## 2022-07-29 MED ORDER — WEGOVY 1 MG/0.5ML ~~LOC~~ SOAJ
1.0000 mg | SUBCUTANEOUS | 0 refills | Status: AC
  Filled 2022-07-29: qty 2, 28d supply, fill #0

## 2022-08-19 ENCOUNTER — Other Ambulatory Visit (HOSPITAL_BASED_OUTPATIENT_CLINIC_OR_DEPARTMENT_OTHER): Payer: Self-pay

## 2022-08-19 MED ORDER — WEGOVY 0.5 MG/0.5ML ~~LOC~~ SOAJ
0.5000 mg | SUBCUTANEOUS | 0 refills | Status: AC
  Filled 2022-08-19 – 2022-12-29 (×2): qty 2, 28d supply, fill #0

## 2022-08-19 MED ORDER — WEGOVY 0.25 MG/0.5ML ~~LOC~~ SOAJ
0.2500 mg | SUBCUTANEOUS | 0 refills | Status: AC
  Filled 2022-08-19 – 2022-09-19 (×2): qty 2, 28d supply, fill #0

## 2022-08-23 ENCOUNTER — Other Ambulatory Visit (HOSPITAL_BASED_OUTPATIENT_CLINIC_OR_DEPARTMENT_OTHER): Payer: Self-pay

## 2022-09-02 ENCOUNTER — Other Ambulatory Visit (HOSPITAL_BASED_OUTPATIENT_CLINIC_OR_DEPARTMENT_OTHER): Payer: Self-pay

## 2022-09-08 ENCOUNTER — Encounter: Payer: Self-pay | Admitting: Pharmacist

## 2022-09-08 ENCOUNTER — Other Ambulatory Visit: Payer: Self-pay | Admitting: Oncology

## 2022-09-08 ENCOUNTER — Encounter (HOSPITAL_BASED_OUTPATIENT_CLINIC_OR_DEPARTMENT_OTHER): Payer: Self-pay

## 2022-09-08 ENCOUNTER — Other Ambulatory Visit (HOSPITAL_BASED_OUTPATIENT_CLINIC_OR_DEPARTMENT_OTHER): Payer: Self-pay

## 2022-09-08 DIAGNOSIS — Z006 Encounter for examination for normal comparison and control in clinical research program: Secondary | ICD-10-CM

## 2022-09-19 ENCOUNTER — Other Ambulatory Visit (HOSPITAL_BASED_OUTPATIENT_CLINIC_OR_DEPARTMENT_OTHER): Payer: Self-pay

## 2022-09-20 ENCOUNTER — Other Ambulatory Visit (HOSPITAL_BASED_OUTPATIENT_CLINIC_OR_DEPARTMENT_OTHER): Payer: Self-pay

## 2022-10-20 ENCOUNTER — Other Ambulatory Visit (HOSPITAL_COMMUNITY)
Admission: RE | Admit: 2022-10-20 | Discharge: 2022-10-20 | Disposition: A | Discharge status: Home / Self Care | Payer: 59 | Source: Ambulatory Visit | Attending: Oncology | Admitting: Oncology

## 2022-10-20 DIAGNOSIS — Z006 Encounter for examination for normal comparison and control in clinical research program: Secondary | ICD-10-CM | POA: Insufficient documentation

## 2022-10-25 ENCOUNTER — Encounter: Payer: Self-pay | Admitting: Family Medicine

## 2022-10-25 ENCOUNTER — Ambulatory Visit (INDEPENDENT_AMBULATORY_CARE_PROVIDER_SITE_OTHER): Payer: 59 | Admitting: Family Medicine

## 2022-10-25 VITALS — BP 171/91 | HR 91 | Temp 98.3°F | Resp 16 | Ht 64.0 in | Wt 151.0 lb

## 2022-10-25 DIAGNOSIS — Z0000 Encounter for general adult medical examination without abnormal findings: Secondary | ICD-10-CM | POA: Diagnosis not present

## 2022-10-25 DIAGNOSIS — R7302 Impaired glucose tolerance (oral): Secondary | ICD-10-CM | POA: Diagnosis not present

## 2022-10-25 DIAGNOSIS — Z13220 Encounter for screening for lipoid disorders: Secondary | ICD-10-CM | POA: Diagnosis not present

## 2022-10-25 DIAGNOSIS — Z136 Encounter for screening for cardiovascular disorders: Secondary | ICD-10-CM

## 2022-10-25 DIAGNOSIS — Z1211 Encounter for screening for malignant neoplasm of colon: Secondary | ICD-10-CM

## 2022-10-25 NOTE — Progress Notes (Signed)
Complete physical exam and establish care  Patient: Bethany Baker   DOB: [birth year]   57 y.o. Female  MRN: [id number]  Subjective:    Chief Complaint  Patient presents with   Establish Care    Tbd medications,pt is open to scheduling a fup appt for med refills   Annual Exam    Bethany Baker is a 57 y.o. female who presents today for a complete physical exam. She reports consuming a general diet. The patient does not participate in regular exercise at present. She generally feels fairly well. She reports sleeping fairly well. She does not have additional problems to discuss today.  Discussed with pt that she will return for chronic conditions.  Pt has had colonoscopy done in the last 10 years. She is willing to do cologuard instead. Pt declines mammogram.  Pt declines pap smears.  Pt plans to get the flu vaccine with work, she works for [employer]. She declines other vaccines.   Most recent fall risk assessment:     No data to display           Most recent depression screenings:     No data to display          Vision:Within last year and Dental: No current dental problems  Patient Active Problem List   Diagnosis Date Noted   Thoracic aortic aneurysm without rupture (HCC) 03/09/2021   Essential (primary) hypertension 08/26/2020   Anxiety disorder, unspecified 08/26/2020   Chronic fatigue, unspecified 08/26/2020   Impingement syndrome of left shoulder region 12/16/2019   OSA (obstructive sleep apnea) 06/10/2019   Thyroid disease    Systolic murmur    MVP (mitral valve prolapse)    MR (mitral regurgitation)    Fibromyalgia    Depression    Aortic insufficiency    EXCESSIVE OR FREQUENT MENSTRUATION 10/20/2008   IRRITABLE BOWEL SYNDROME 06/19/2008   GERD 04/21/2008   DYSPHAGIA UNSPECIFIED 04/21/2008   FATIGUE 12/17/2007   BACK PAIN, THORACIC REGION 10/19/2007   DEPRESSION 09/07/2007   ADD 09/07/2007   MIGRAINE HEADACHE 09/07/2007   MITRAL VALVE  PROLAPSE 09/07/2007   ALLERGIC RHINITIS 09/07/2007   ASTHMA 09/07/2007   FIBROMYALGIA 09/07/2007   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   NONSPECIFIC ABNORM RESULTS THYROID FUNCT STUDY 09/07/2007   NEPHROLITHIASIS, HX OF 09/07/2007   Past Medical History:  Diagnosis Date   Allergy    Anxiety    Aortic insufficiency    Arthritis    Asthma    Depression    Fibromyalgia    GERD (gastroesophageal reflux disease)    Hyperlipidemia    Hypertension    MR (mitral regurgitation)    MVP (mitral valve prolapse)    OSA (obstructive sleep apnea) 06/10/2019   Sleep apnea    Systolic murmur    Thyroid disease    HYPOTHYROIDISM   Past Surgical History:  Procedure Laterality Date   CHOLECYSTECTOMY     TRANSESOPHAGEAL ECHOCARDIOGRAM     TUBAL LIGATION     Social History   Tobacco Use   Smoking status: Never   Smokeless tobacco: Never  Substance Use Topics   Alcohol use: No   Drug use: No   Family Status  Relation Name Status   Mother Kendal Hymen Alive   Father Nadine Counts Alive   Brother Onalee Hua Alive   MGM Renea Ee Alive   MGF  Deceased   PGM  Deceased   PGF  Deceased   Mat Aunt Willene Dimattia (Not Specified)  No partnership  data on file   Family History  Problem Relation Age of Onset   Mitral valve prolapse Mother    Cancer Mother    Fibromyalgia Mother    Arthritis Mother    Hyperlipidemia Mother    Hypertension Father    Heart failure Father    Alcohol abuse Father    Cancer Father    Hearing loss Father    Heart disease Father    Hyperlipidemia Father    Varicose Veins Father    Hypertension Brother    Alcohol abuse Brother    Drug abuse Brother    Heart disease Brother    Hyperlipidemia Brother    Kidney disease Brother    Obesity Brother    Arthritis Maternal Grandmother    Cancer Maternal Grandmother    Cancer Maternal Aunt    Allergies  Allergen Reactions   Acetaminophen    Adhesive [Tape]     irritation   Aspirin    Effexor [Venlafaxine]     Paranoid when  driving   Egg-Derived Products Other (See Comments)   Ibuprofen    Mercury Swelling   Wellbutrin [Bupropion]     Paranoid while driving   Atenolol     Memory loss      Patient Care Team: Suzan Slick, MD as PCP - General (Family Medicine) Lennette Bihari, MD as PCP - Cardiology (Cardiology)   Outpatient Medications Prior to Visit  Medication Sig   amphetamine-dextroamphetamine (ADDERALL) 5 MG tablet Take 1 tablet (5 mg total) by mouth 2 (two) times daily.   EPINEPHrine (EPIPEN 2-PAK) 0.3 mg/0.3 mL IJ SOAJ injection Use as needed and as directed for allergic reaction   esomeprazole (NEXIUM) 20 MG capsule Take 20 mg by mouth daily at 12 noon.   irbesartan (AVAPRO) 300 MG tablet Take 1 tablet (300 mg total) by mouth daily.   levothyroxine (SYNTHROID) 50 MCG tablet Take 1 tablet (50 mcg total) by mouth in the morning on an empty stomach, and then take an extra 1/2 tablet (25 mcg total) on Sundays only.   liothyronine (CYTOMEL) 5 MCG tablet Take 1 tablet (5 mcg total) by mouth 2 (two) times daily before a meal.   loratadine (CLARITIN) 10 MG tablet Take 10 mg by mouth daily.   methocarbamol (ROBAXIN) 500 MG tablet Take 1 tablet (500 mg total) by mouth every 8 (eight) hours.   metoprolol succinate (TOPROL-XL) 50 MG 24 hr tablet Take 1 tablet (50 mg total) by mouth daily.   montelukast (SINGULAIR) 10 MG tablet 1 tablet in the evening   Multiple Vitamin (MULTIVITAMIN) tablet Take 1 tablet by mouth daily.   Semaglutide-Weight Management (WEGOVY) 2.4 MG/0.75ML SOAJ Inject 2.4 mg into the skin once a week.   sertraline (ZOLOFT) 50 MG tablet Take 1.5 tablets (75 mg total) by mouth daily.   albuterol (VENTOLIN HFA) 108 (90 Base) MCG/ACT inhaler INHALE 2 PUFFS BY MOUTH EVERY 4 HOURS AS NEEDED FOR SHORTNESS OF BREATH OR WHEEZING   ALPRAZolam (XANAX) 0.5 MG tablet Take 0.5 mg by mouth at bedtime as needed for anxiety.   ALPRAZolam (XANAX) 0.5 MG tablet Take 1 tablet (0.5 mg total) by mouth daily  as needed.   amphetamine-dextroamphetamine (ADDERALL) 5 MG tablet Take 1 tablet by mouth twice a day   amphetamine-dextroamphetamine (ADDERALL) 5 MG tablet Take 1 tablet (5 mg total) by mouth 2 (two) times daily.   levothyroxine (SYNTHROID) 50 MCG tablet TAKE ONE TABLET BY MOUTH ON AN EMPTY STOMACH DAILY IN  THE MORNING AND AN EXTRA 1/2 TABLET ON SUNDAYS   liothyronine (CYTOMEL) 5 MCG tablet TAKE ONE TABLET BY MOUTH TWICE DAILY ON AN EMPTY STOMACH   liothyronine (CYTOMEL) 5 MCG tablet Take 1 tablet (5 mcg total) by mouth 2 (two) times daily before a meal (on an empty stomach).   pantoprazole (PROTONIX) 40 MG tablet TAKE ONE TABLET BY MOUTH ONCE DAILY   Semaglutide-Weight Management (WEGOVY) 0.25 MG/0.5ML SOAJ Inject 0.25 mg into the skin once a week. (Patient not taking: Reported on 10/25/2022)   Semaglutide-Weight Management (WEGOVY) 0.5 MG/0.5ML SOAJ Inject 0.5 mg into the skin every 7 (seven) days. (Patient not taking: Reported on 10/25/2022)   Semaglutide-Weight Management (WEGOVY) 1 MG/0.5ML SOAJ Inject 1 mg into the skin once a week. (Patient not taking: Reported on 10/25/2022)   Semaglutide-Weight Management (WEGOVY) 1.7 MG/0.75ML SOAJ Inject 1.7 mg into the skin once a week. (Patient not taking: Reported on 03/10/2022)   [DISCONTINUED] amphetamine-dextroamphetamine (ADDERALL) 15 MG tablet Take by mouth daily. Pt takes 0.5 tablet (Patient not taking: Reported on 03/10/2022)   [DISCONTINUED] diclofenac Sodium (VOLTAREN) 1 % GEL 4 grams   [DISCONTINUED] doxycycline (VIBRAMYCIN) 100 MG capsule Take 1 capsule (100 mg total) by mouth 2 (two) times daily for 7 days   [DISCONTINUED] FLUoxetine (PROZAC) 20 MG capsule Take 1 capsule by mouth once daily for 14 days   [DISCONTINUED] FLUoxetine (PROZAC) 40 MG capsule take 1 capsule by mouth every morning   [DISCONTINUED] hydroxychloroquine (PLAQUENIL) 200 MG tablet 2 tablets   [DISCONTINUED] lidocaine (LIDODERM) 5 % 1 patch to intact skin remove after 12 hours    No facility-administered medications prior to visit.    Review of Systems  All other systems reviewed and are negative.        Objective:     BP (!) 171/91   Pulse 91   Temp 98.3 F (36.8 C) (Oral)   Resp 16   Ht 5\' 4"  (1.626 m)   Wt 151 lb (68.5 kg)   SpO2 99%   BMI 25.92 kg/m  BP Readings from Last 3 Encounters:  10/25/22 (!) 171/91  03/10/22 (!) 110/58  03/18/21 (!) 143/83      Physical Exam Vitals and nursing note reviewed.  Constitutional:      Appearance: Normal appearance. She is normal weight.  HENT:     Head: Normocephalic and atraumatic.     Right Ear: Tympanic membrane, ear canal and external ear normal.     Left Ear: Tympanic membrane, ear canal and external ear normal.     Nose: Nose normal.     Mouth/Throat:     Mouth: Mucous membranes are moist.     Pharynx: Oropharynx is clear.  Eyes:     Conjunctiva/sclera: Conjunctivae normal.     Pupils: Pupils are equal, round, and reactive to light.  Cardiovascular:     Rate and Rhythm: Normal rate and regular rhythm.     Pulses: Normal pulses.     Heart sounds: Normal heart sounds.  Pulmonary:     Effort: Pulmonary effort is normal.     Breath sounds: Normal breath sounds.  Abdominal:     General: Abdomen is flat. Bowel sounds are normal.  Skin:    General: Skin is warm.     Capillary Refill: Capillary refill takes less than 2 seconds.  Neurological:     General: No focal deficit present.     Mental Status: She is alert and oriented to person, place, and time. Mental  status is at baseline.  Psychiatric:        Mood and Affect: Mood normal.        Behavior: Behavior normal.        Thought Content: Thought content normal.        Judgment: Judgment normal.     No results found for any visits on 10/25/22. Last CBC Lab Results  Component Value Date   WBC 6.7 03/11/2022   HGB 14.4 03/11/2022   HCT 40.7 03/11/2022   MCV 103 (H) 03/11/2022   MCH 36.5 (H) 03/11/2022   RDW 12.9 03/11/2022    PLT 192 03/11/2022   Last metabolic panel Lab Results  Component Value Date   GLUCOSE 96 03/11/2022   NA 141 03/11/2022   K 3.8 03/11/2022   CL 102 03/11/2022   CO2 27 03/11/2022   BUN 12 03/11/2022   CREATININE 0.82 03/11/2022   EGFR 84 03/11/2022   CALCIUM 9.6 03/11/2022   PROT 6.7 03/11/2022   ALBUMIN 4.6 03/11/2022   LABGLOB 2.1 03/11/2022   AGRATIO 2.2 03/11/2022   BILITOT 0.7 03/11/2022   ALKPHOS 86 03/11/2022   AST 14 03/11/2022   ALT 12 03/11/2022        Assessment & Plan:    Routine Health Maintenance and Physical Exam  Immunization History  Administered Date(s) Administered   Influenza Whole 01/21/2008   Influenza-Unspecified 11/30/2016, 12/18/2021   Td 09/08/2003    Health Maintenance  Topic Date Due   COVID-19 Vaccine (1) Never done   HIV Screening  Never done   Hepatitis C Screening  Never done   Zoster Vaccines- Shingrix (1 of 2) Never done   PAP SMEAR-Modifier  04/15/2011   Colonoscopy  Never done   DTaP/Tdap/Td (2 - Tdap) 09/07/2013   MAMMOGRAM  Never done   INFLUENZA VACCINE  09/29/2022   HPV VACCINES  Aged Out    Discussed health benefits of physical activity, and encouraged her to engage in regular exercise appropriate for her age and condition.  Problem List Items Addressed This Visit   None  No follow-ups on file. Annual physical exam  Encounter for lipid screening for cardiovascular disease -     Lipid panel  Impaired glucose tolerance -     CBC with Differential/Platelet -     Comprehensive metabolic panel -     Hemoglobin A1c  Screening for colon cancer -     Cologuard   Screening CPE labs Send cologuard See in a few weeks for chronic condition follow up    Suzan Slick, MD

## 2022-10-26 LAB — HEMOGLOBIN A1C
Est. average glucose Bld gHb Est-mCnc: 94 mg/dL
Hgb A1c MFr Bld: 4.9 % (ref 4.8–5.6)

## 2022-10-26 LAB — COMPREHENSIVE METABOLIC PANEL
ALT: 21 IU/L (ref 0–32)
AST: 24 IU/L (ref 0–40)
Albumin: 4.3 g/dL (ref 3.8–4.9)
Alkaline Phosphatase: 94 IU/L (ref 44–121)
BUN/Creatinine Ratio: 14 (ref 9–23)
BUN: 11 mg/dL (ref 6–24)
Bilirubin Total: 0.3 mg/dL (ref 0.0–1.2)
CO2: 26 mmol/L (ref 20–29)
Calcium: 9.3 mg/dL (ref 8.7–10.2)
Chloride: 106 mmol/L (ref 96–106)
Creatinine, Ser: 0.81 mg/dL (ref 0.57–1.00)
Globulin, Total: 1.9 g/dL (ref 1.5–4.5)
Glucose: 83 mg/dL (ref 70–99)
Potassium: 3.4 mmol/L — ABNORMAL LOW (ref 3.5–5.2)
Sodium: 145 mmol/L — ABNORMAL HIGH (ref 134–144)
Total Protein: 6.2 g/dL (ref 6.0–8.5)
eGFR: 85 mL/min/{1.73_m2} (ref >59)

## 2022-10-26 LAB — CBC WITH DIFFERENTIAL/PLATELET
Basophils Absolute: 0 10*3/uL (ref 0.0–0.2)
Basos: 0 %
EOS (ABSOLUTE): 0.3 10*3/uL (ref 0.0–0.4)
Eos: 4 %
Hematocrit: 39 % (ref 34.0–46.6)
Hemoglobin: 13.3 g/dL (ref 11.1–15.9)
Immature Grans (Abs): 0 10*3/uL (ref 0.0–0.1)
Immature Granulocytes: 0 %
Lymphocytes Absolute: 3.4 10*3/uL — ABNORMAL HIGH (ref 0.7–3.1)
Lymphs: 49 %
MCH: 35.2 pg — ABNORMAL HIGH (ref 26.6–33.0)
MCHC: 34.1 g/dL (ref 31.5–35.7)
MCV: 103 fL — ABNORMAL HIGH (ref 79–97)
Monocytes Absolute: 0.5 10*3/uL (ref 0.1–0.9)
Monocytes: 7 %
Neutrophils Absolute: 2.8 10*3/uL (ref 1.4–7.0)
Neutrophils: 40 %
Platelets: 167 10*3/uL (ref 150–450)
RBC: 3.78 x10E6/uL (ref 3.77–5.28)
RDW: 12.6 % (ref 11.7–15.4)
WBC: 6.9 10*3/uL (ref 3.4–10.8)

## 2022-10-26 LAB — LIPID PANEL
Chol/HDL Ratio: 3.2 ratio (ref 0.0–4.4)
Cholesterol, Total: 213 mg/dL — ABNORMAL HIGH (ref 100–199)
HDL: 67 mg/dL (ref >39)
LDL Chol Calc (NIH): 127 mg/dL — ABNORMAL HIGH (ref 0–99)
Triglycerides: 110 mg/dL (ref 0–149)
VLDL Cholesterol Cal: 19 mg/dL (ref 5–40)

## 2022-10-27 ENCOUNTER — Ambulatory Visit: Payer: Self-pay | Admitting: Orthopedic Surgery

## 2022-10-29 LAB — GENECONNECT MOLECULAR SCREEN: Genetic Analysis Overall Interpretation: NEGATIVE

## 2022-11-01 ENCOUNTER — Ambulatory Visit: Payer: 59 | Admitting: Family Medicine

## 2022-11-01 ENCOUNTER — Ambulatory Visit (INDEPENDENT_AMBULATORY_CARE_PROVIDER_SITE_OTHER): Payer: 59 | Admitting: Internal Medicine

## 2022-11-01 ENCOUNTER — Other Ambulatory Visit (HOSPITAL_BASED_OUTPATIENT_CLINIC_OR_DEPARTMENT_OTHER): Payer: Self-pay

## 2022-11-01 ENCOUNTER — Encounter: Payer: Self-pay | Admitting: Internal Medicine

## 2022-11-01 VITALS — BP 160/74 | HR 78 | Temp 98.5°F | Ht 64.5 in | Wt 154.6 lb

## 2022-11-01 DIAGNOSIS — F419 Anxiety disorder, unspecified: Secondary | ICD-10-CM

## 2022-11-01 DIAGNOSIS — E039 Hypothyroidism, unspecified: Secondary | ICD-10-CM | POA: Diagnosis not present

## 2022-11-01 DIAGNOSIS — F330 Major depressive disorder, recurrent, mild: Secondary | ICD-10-CM

## 2022-11-01 DIAGNOSIS — M797 Fibromyalgia: Secondary | ICD-10-CM

## 2022-11-01 DIAGNOSIS — G4733 Obstructive sleep apnea (adult) (pediatric): Secondary | ICD-10-CM

## 2022-11-01 DIAGNOSIS — I10 Essential (primary) hypertension: Secondary | ICD-10-CM | POA: Diagnosis not present

## 2022-11-01 MED ORDER — SERTRALINE HCL 50 MG PO TABS
50.0000 mg | ORAL_TABLET | Freq: Every day | ORAL | 0 refills | Status: DC
Start: 2022-11-01 — End: 2022-12-29
  Filled 2022-11-01: qty 90, 90d supply, fill #0

## 2022-11-01 NOTE — Assessment & Plan Note (Signed)
    11/01/2022    1:44 PM  GAD 7 : Generalized Anxiety Score  Nervous, Anxious, on Edge 3  Control/stop worrying 3  Worry too much - different things 3  Trouble relaxing 3  Restless 0  Easily annoyed or irritable 3  Afraid - awful might happen 3  Total GAD 7 Score 18  Anxiety Difficulty Somewhat difficult   -Resume Zoloft 50 mg daily, consider psychiatry referral if no improvement.

## 2022-11-01 NOTE — Assessment & Plan Note (Signed)
Flowsheet Row Office Visit from 11/01/2022 in [facility]  PHQ-9 Total Score 12      -Resume Zoloft 50 mg daily, consider psychiatry referral if no improvement.

## 2022-11-01 NOTE — Assessment & Plan Note (Signed)
Not well-controlled.  This is currently being managed by cardiology.  Have advised that she reach out to them given it is imperative for her to have good BP management given her ascending thoracic aneurysm.

## 2022-11-01 NOTE — Progress Notes (Signed)
New Patient Office Visit     CC/Reason for Visit: Establish care, discuss chronic concerns. Previous PCP: Pearson Grippe, MD Last Visit: Unknown  HPI: Bethany Baker is a 57 y.o. female who is coming in today for the above mentioned reasons. Past Medical History is extensive but mostly significant for hypertension, ascending thoracic aortic aneurysm, OSA, hypothyroidism, anxiety/depression, ADHD, fibromyalgia, chronic back pain, hearing loss.  She has been out of her sertraline for some time and is requesting refills.  Mainly for anxiety but also for depression.  She has not seen psychiatry in a long time.   Past Medical/Surgical History: Past Medical History:  Diagnosis Date   Allergy    Anxiety    Aortic insufficiency    Arthritis    Asthma    Depression    Fibromyalgia    GERD (gastroesophageal reflux disease)    Hyperlipidemia    Hypertension    MR (mitral regurgitation)    MVP (mitral valve prolapse)    OSA (obstructive sleep apnea) 06/10/2019   Sleep apnea    Systolic murmur    Thyroid disease    HYPOTHYROIDISM    Past Surgical History:  Procedure Laterality Date   CHOLECYSTECTOMY     TRANSESOPHAGEAL ECHOCARDIOGRAM     TUBAL LIGATION      Social History:  reports that she has never smoked. She has never used smokeless tobacco. She reports that she does not drink alcohol and does not use drugs.  Allergies: Allergies  Allergen Reactions   Acetaminophen    Adhesive [Tape]     irritation   Aspirin    Effexor [Venlafaxine]     Paranoid when driving   Egg-Derived Products Other (See Comments)   Ibuprofen    Mercury Swelling   Wellbutrin [Bupropion]     Paranoid while driving   Atenolol     Memory loss    Family History:  Family History  Problem Relation Age of Onset   Mitral valve prolapse Mother    Cancer Mother    Fibromyalgia Mother    Arthritis Mother    Hyperlipidemia Mother    Hypertension Father    Heart failure Father    Alcohol abuse  Father    Cancer Father    Hearing loss Father    Heart disease Father    Hyperlipidemia Father    Varicose Veins Father    Hypertension Brother    Alcohol abuse Brother    Drug abuse Brother    Heart disease Brother    Hyperlipidemia Brother    Kidney disease Brother    Obesity Brother    Arthritis Maternal Grandmother    Cancer Maternal Grandmother    Cancer Maternal Aunt      Current Outpatient Medications:    albuterol (VENTOLIN HFA) 108 (90 Base) MCG/ACT inhaler, INHALE 2 PUFFS BY MOUTH EVERY 4 HOURS AS NEEDED FOR SHORTNESS OF BREATH OR WHEEZING, Disp: 18 g, Rfl: 1   ALPRAZolam (XANAX) 0.5 MG tablet, Take 1 tablet (0.5 mg total) by mouth daily as needed., Disp: 30 tablet, Rfl: 0   amphetamine-dextroamphetamine (ADDERALL) 5 MG tablet, Take 1 tablet by mouth twice a day, Disp: 60 tablet, Rfl: 0   amphetamine-dextroamphetamine (ADDERALL) 5 MG tablet, Take 1 tablet (5 mg total) by mouth 2 (two) times daily., Disp: 60 tablet, Rfl: 0   amphetamine-dextroamphetamine (ADDERALL) 5 MG tablet, Take 1 tablet (5 mg total) by mouth 2 (two) times daily., Disp: 60 tablet, Rfl: 0   EPINEPHrine (  EPIPEN 2-PAK) 0.3 mg/0.3 mL IJ SOAJ injection, Use as needed and as directed for allergic reaction, Disp: 2 each, Rfl: 0   esomeprazole (NEXIUM) 20 MG capsule, Take 20 mg by mouth daily at 12 noon., Disp: , Rfl:    irbesartan (AVAPRO) 300 MG tablet, Take 1 tablet (300 mg total) by mouth daily., Disp: 90 tablet, Rfl: 3   ketorolac (TORADOL) 10 MG tablet, Take 10 mg by mouth every 6 (six) hours as needed., Disp: , Rfl:    levothyroxine (SYNTHROID) 50 MCG tablet, TAKE ONE TABLET BY MOUTH ON AN EMPTY STOMACH DAILY IN THE MORNING AND AN EXTRA 1/2 TABLET ON SUNDAYS, Disp: 90 tablet, Rfl: 1   levothyroxine (SYNTHROID) 50 MCG tablet, Take 1 tablet (50 mcg total) by mouth in the morning on an empty stomach, and then take an extra 1/2 tablet (25 mcg total) on Sundays only., Disp: 90 tablet, Rfl: 0   liothyronine  (CYTOMEL) 5 MCG tablet, TAKE ONE TABLET BY MOUTH TWICE DAILY ON AN EMPTY STOMACH, Disp: 180 tablet, Rfl: 1   liothyronine (CYTOMEL) 5 MCG tablet, Take 1 tablet (5 mcg total) by mouth 2 (two) times daily before a meal., Disp: 60 tablet, Rfl: 0   liothyronine (CYTOMEL) 5 MCG tablet, Take 1 tablet (5 mcg total) by mouth 2 (two) times daily before a meal (on an empty stomach)., Disp: 60 tablet, Rfl: 0   loratadine (CLARITIN) 10 MG tablet, Take 10 mg by mouth daily., Disp: , Rfl:    methocarbamol (ROBAXIN) 500 MG tablet, Take 1 tablet (500 mg total) by mouth every 8 (eight) hours., Disp: 90 tablet, Rfl: 1   montelukast (SINGULAIR) 10 MG tablet, 1 tablet in the evening, Disp: , Rfl:    Multiple Vitamin (MULTIVITAMIN) tablet, Take 1 tablet by mouth daily., Disp: , Rfl:    pantoprazole (PROTONIX) 40 MG tablet, TAKE ONE TABLET BY MOUTH ONCE DAILY, Disp: 90 tablet, Rfl: 3   Semaglutide-Weight Management (WEGOVY) 0.25 MG/0.5ML SOAJ, Inject 0.25 mg into the skin once a week., Disp: 2 mL, Rfl: 0   Semaglutide-Weight Management (WEGOVY) 0.5 MG/0.5ML SOAJ, Inject 0.5 mg into the skin every 7 (seven) days., Disp: 2 mL, Rfl: 0   Semaglutide-Weight Management (WEGOVY) 1 MG/0.5ML SOAJ, Inject 1 mg into the skin once a week., Disp: 2 mL, Rfl: 0   Semaglutide-Weight Management (WEGOVY) 1.7 MG/0.75ML SOAJ, Inject 1.7 mg into the skin once a week., Disp: 3 mL, Rfl: 0   Semaglutide-Weight Management (WEGOVY) 2.4 MG/0.75ML SOAJ, Inject 2.4 mg into the skin once a week., Disp: 3 mL, Rfl: 3   traMADol (ULTRAM) 50 MG tablet, Take by mouth every 6 (six) hours as needed., Disp: , Rfl:    metoprolol succinate (TOPROL-XL) 50 MG 24 hr tablet, Take 1 tablet (50 mg total) by mouth daily. (Patient not taking: Reported on 11/01/2022), Disp: 90 tablet, Rfl: 3   sertraline (ZOLOFT) 50 MG tablet, Take 1 tablet (50 mg total) by mouth daily., Disp: 90 tablet, Rfl: 0  Review of Systems:  Negative except as indicated in HPI.   Physical  Exam: Vitals:   11/01/22 1302 11/01/22 1306  BP: (!) 170/80 (!) 160/74  Pulse: 78   Temp: 98.5 F (36.9 C)   TempSrc: Oral   SpO2: 98%   Weight: 154 lb 9.6 oz (70.1 kg)   Height: 5' 4.5" (1.638 m)    Body mass index is 26.13 kg/m.  Physical Exam Vitals reviewed.  Constitutional:      Appearance: Normal appearance.  HENT:     Head: Normocephalic and atraumatic.  Eyes:     Conjunctiva/sclera: Conjunctivae normal.     Pupils: Pupils are equal, round, and reactive to light.  Cardiovascular:     Rate and Rhythm: Normal rate and regular rhythm.  Pulmonary:     Effort: Pulmonary effort is normal.     Breath sounds: Normal breath sounds.  Skin:    General: Skin is warm and dry.  Neurological:     General: No focal deficit present.     Mental Status: She is alert and oriented to person, place, and time.  Psychiatric:        Mood and Affect: Mood normal.        Behavior: Behavior normal.        Thought Content: Thought content normal.        Judgment: Judgment normal.       Impression and Plan:  Mild episode of recurrent major depressive disorder [facility]) Assessment & Plan: Flowsheet Row Office Visit from 11/01/2022 in [facility] [place]  PHQ-9 Total Score 12      -Resume Zoloft 50 mg daily, consider psychiatry referral if no improvement.  Orders: -     Sertraline HCl; Take 1 tablet (50 mg total) by mouth daily.  Dispense: 90 tablet; Refill: 0  Essential (primary) hypertension Assessment & Plan: Not well-controlled.  This is currently being managed by cardiology.  Have advised that she reach out to them given it is imperative for her to have good BP management given her ascending thoracic aneurysm.   Anxiety disorder, unspecified type Assessment & Plan:    11/01/2022    1:44 PM  GAD 7 : Generalized Anxiety Score  Nervous, Anxious, on Edge 3  Control/stop worrying 3  Worry too much - different things 3  Trouble relaxing 3  Restless 0   Easily annoyed or irritable 3  Afraid - awful might happen 3  Total GAD 7 Score 18  Anxiety Difficulty Somewhat difficult   -Resume Zoloft 50 mg daily, consider psychiatry referral if no improvement.     Acquired hypothyroidism Assessment & Plan: Recent TSH within range, continue current.   Fibromyalgia  OSA (obstructive sleep apnea) Assessment & Plan: Noted.    Time spent: 46 minutes reviewing chart, interviewing and examining patient and formulating plan of care.     Chaya Jan, MD [facility]

## 2022-11-01 NOTE — Assessment & Plan Note (Signed)
Noted  

## 2022-11-01 NOTE — Assessment & Plan Note (Signed)
Recent TSH within range, continue current.

## 2022-11-08 ENCOUNTER — Ambulatory Visit: Payer: 59 | Admitting: Internal Medicine

## 2022-11-28 DIAGNOSIS — H903 Sensorineural hearing loss, bilateral: Secondary | ICD-10-CM | POA: Diagnosis not present

## 2022-12-02 ENCOUNTER — Other Ambulatory Visit: Payer: Self-pay | Admitting: Internal Medicine

## 2022-12-02 ENCOUNTER — Encounter: Payer: Self-pay | Admitting: Internal Medicine

## 2022-12-02 DIAGNOSIS — Z1212 Encounter for screening for malignant neoplasm of rectum: Secondary | ICD-10-CM

## 2022-12-02 DIAGNOSIS — Z1211 Encounter for screening for malignant neoplasm of colon: Secondary | ICD-10-CM

## 2022-12-05 ENCOUNTER — Ambulatory Visit: Payer: 59 | Admitting: Internal Medicine

## 2022-12-29 ENCOUNTER — Other Ambulatory Visit: Payer: Self-pay

## 2022-12-29 ENCOUNTER — Other Ambulatory Visit (HOSPITAL_BASED_OUTPATIENT_CLINIC_OR_DEPARTMENT_OTHER): Payer: Self-pay

## 2022-12-29 ENCOUNTER — Other Ambulatory Visit: Payer: Self-pay | Admitting: Internal Medicine

## 2022-12-29 DIAGNOSIS — F330 Major depressive disorder, recurrent, mild: Secondary | ICD-10-CM

## 2022-12-29 MED ORDER — SERTRALINE HCL 50 MG PO TABS
50.0000 mg | ORAL_TABLET | Freq: Every day | ORAL | 1 refills | Status: DC
Start: 2022-12-29 — End: 2023-04-19
  Filled 2022-12-29 – 2023-01-31 (×2): qty 90, 90d supply, fill #0

## 2022-12-30 ENCOUNTER — Other Ambulatory Visit (HOSPITAL_BASED_OUTPATIENT_CLINIC_OR_DEPARTMENT_OTHER): Payer: Self-pay

## 2023-01-31 ENCOUNTER — Other Ambulatory Visit (HOSPITAL_BASED_OUTPATIENT_CLINIC_OR_DEPARTMENT_OTHER): Payer: Self-pay

## 2023-01-31 ENCOUNTER — Other Ambulatory Visit: Payer: Self-pay

## 2023-02-01 ENCOUNTER — Encounter: Payer: Self-pay | Admitting: Cardiovascular Disease

## 2023-02-16 ENCOUNTER — Ambulatory Visit
Admission: RE | Admit: 2023-02-16 | Discharge: 2023-02-16 | Disposition: A | Discharge status: Home / Self Care | Payer: 59 | Source: Ambulatory Visit | Attending: Cardiovascular Disease | Admitting: Cardiovascular Disease

## 2023-02-16 DIAGNOSIS — I77810 Thoracic aortic ectasia: Secondary | ICD-10-CM

## 2023-02-16 DIAGNOSIS — I7121 Aneurysm of the ascending aorta, without rupture: Secondary | ICD-10-CM | POA: Diagnosis not present

## 2023-02-16 DIAGNOSIS — I351 Nonrheumatic aortic (valve) insufficiency: Secondary | ICD-10-CM

## 2023-02-16 MED ORDER — IOPAMIDOL (ISOVUE-370) INJECTION 76%
200.0000 mL | Freq: Once | INTRAVENOUS | Status: AC | PRN
  Administered 2023-02-16: 75 mL via INTRAVENOUS

## 2023-04-04 DIAGNOSIS — Z1211 Encounter for screening for malignant neoplasm of colon: Secondary | ICD-10-CM | POA: Diagnosis not present

## 2023-04-16 LAB — COLOGUARD: COLOGUARD: NEGATIVE

## 2023-04-18 ENCOUNTER — Encounter: Payer: Self-pay | Admitting: Internal Medicine

## 2023-04-18 DIAGNOSIS — F330 Major depressive disorder, recurrent, mild: Secondary | ICD-10-CM

## 2023-04-18 LAB — COLOGUARD: Cologuard: NEGATIVE

## 2023-04-18 NOTE — Telephone Encounter (Signed)
 Abstracted

## 2023-04-19 ENCOUNTER — Other Ambulatory Visit (HOSPITAL_BASED_OUTPATIENT_CLINIC_OR_DEPARTMENT_OTHER): Payer: Self-pay

## 2023-04-19 ENCOUNTER — Encounter (HOSPITAL_BASED_OUTPATIENT_CLINIC_OR_DEPARTMENT_OTHER): Payer: Self-pay

## 2023-04-19 MED ORDER — SERTRALINE HCL 50 MG PO TABS
50.0000 mg | ORAL_TABLET | Freq: Every day | ORAL | 1 refills | Status: DC
  Filled 2023-04-19: qty 90, 90d supply, fill #0

## 2023-04-19 MED ORDER — EPINEPHRINE 0.3 MG/0.3ML IJ SOAJ
INTRAMUSCULAR | 1 refills | Status: AC
Start: 2023-04-19 — End: ?
  Filled 2023-04-19: qty 2, 2d supply, fill #0
  Filled 2023-05-18: qty 2, 30d supply, fill #0

## 2023-04-19 NOTE — Addendum Note (Signed)
 Addended by: Kern Reap B on: 04/19/2023 11:21 AM   Modules accepted: Orders

## 2023-05-02 ENCOUNTER — Other Ambulatory Visit (HOSPITAL_BASED_OUTPATIENT_CLINIC_OR_DEPARTMENT_OTHER): Payer: Self-pay

## 2023-05-18 ENCOUNTER — Ambulatory Visit (HOSPITAL_BASED_OUTPATIENT_CLINIC_OR_DEPARTMENT_OTHER)
Admission: RE | Admit: 2023-05-18 | Discharge: 2023-05-18 | Disposition: A | Discharge status: Home / Self Care | Source: Ambulatory Visit | Attending: Family Medicine | Admitting: Family Medicine

## 2023-05-18 ENCOUNTER — Other Ambulatory Visit (HOSPITAL_BASED_OUTPATIENT_CLINIC_OR_DEPARTMENT_OTHER): Payer: Self-pay

## 2023-05-18 ENCOUNTER — Ambulatory Visit
Admission: RE | Admit: 2023-05-18 | Discharge: 2023-05-18 | Disposition: A | Discharge status: Home / Self Care | Source: Ambulatory Visit | Attending: Family Medicine | Admitting: Family Medicine

## 2023-05-18 VITALS — BP 157/82 | HR 87 | Temp 99.0°F | Resp 16

## 2023-05-18 DIAGNOSIS — M25561 Pain in right knee: Secondary | ICD-10-CM | POA: Insufficient documentation

## 2023-05-18 DIAGNOSIS — H9201 Otalgia, right ear: Secondary | ICD-10-CM

## 2023-05-18 MED ORDER — TRAMADOL HCL 50 MG PO TABS
50.0000 mg | ORAL_TABLET | Freq: Four times a day (QID) | ORAL | 0 refills | Status: AC | PRN
  Filled 2023-05-18: qty 12, 3d supply, fill #0

## 2023-05-18 MED ORDER — METHYLPREDNISOLONE 4 MG PO TBPK
ORAL_TABLET | ORAL | 0 refills | Status: AC
  Filled 2023-05-18: qty 21, 6d supply, fill #0

## 2023-05-18 NOTE — ED Triage Notes (Signed)
 Pt states right knee pain after climbing up on the bed, states she heard a pop.  Pt also having right ear pain.

## 2023-05-18 NOTE — Discharge Instructions (Signed)
 Take the Medrol Dosepak as instructed in the package.  Take tramadol 50 mg-- 1 tablet every 6 hours as needed for pain.  This medication can make you sleepy or dizzy  Take Claritin or Allegra as needed for allergies  Please follow-up with your primary about this issue

## 2023-05-18 NOTE — ED Provider Notes (Signed)
 [facility]    CSN: [id number] Arrival date & time: 05/18/23  1511      History   Chief Complaint Chief Complaint  Patient presents with   Knee Pain    Ear ache also. - Entered by patient    HPI Bethany Baker is a 58 y.o. female.    Knee Pain Here for right anterior knee pain. It began on 3/16, when she was climbing into bed she felt a pop in her right knee. Bothers her some when walking and when flexes at the knee.  Ibuprofen and tylenol cause some lip swelling and itching. Tolerates tramadol and toradol.  Has also been having some right ear pain for a few days. Today has had a little nasal drainage. No fever noted at home. No cough.  Past Medical History:  Diagnosis Date   Allergy    Anxiety    Aortic insufficiency    Arthritis    Asthma    Depression    Fibromyalgia    GERD (gastroesophageal reflux disease)    Hyperlipidemia    Hypertension    MR (mitral regurgitation)    MVP (mitral valve prolapse)    OSA (obstructive sleep apnea) 06/10/2019   Sleep apnea    Systolic murmur    Thyroid disease    HYPOTHYROIDISM    Patient Active Problem List   Diagnosis Date Noted   Acquired hypothyroidism 11/01/2022   Thoracic aortic aneurysm without rupture (HCC) 03/09/2021   Essential (primary) hypertension 08/26/2020   Anxiety disorder, unspecified 08/26/2020   Chronic fatigue, unspecified 08/26/2020   Impingement syndrome of left shoulder region 12/16/2019   OSA (obstructive sleep apnea) 06/10/2019   Thyroid disease    Systolic murmur    MVP (mitral valve prolapse)    MR (mitral regurgitation)    Fibromyalgia    Depression    Aortic insufficiency    EXCESSIVE OR FREQUENT MENSTRUATION 10/20/2008   IRRITABLE BOWEL SYNDROME 06/19/2008   GERD 04/21/2008   Dysphagia 04/21/2008   FATIGUE 12/17/2007   BACK PAIN, THORACIC REGION 10/19/2007   DEPRESSION 09/07/2007   Attention deficit disorder 09/07/2007   Migraine headache 09/07/2007   Mitral valve  disease 09/07/2007   Allergic rhinitis 09/07/2007   Asthma 09/07/2007   FIBROMYALGIA 09/07/2007   Spina bifida with hydrocephalus (HCC) 09/07/2007   NONSPECIFIC ABNORM RESULTS THYROID FUNCT STUDY 09/07/2007   NEPHROLITHIASIS, HX OF 09/07/2007    Past Surgical History:  Procedure Laterality Date   CHOLECYSTECTOMY     TRANSESOPHAGEAL ECHOCARDIOGRAM     TUBAL LIGATION      OB History   No obstetric history on file.      Home Medications    Prior to Admission medications   Medication Sig Start Date End Date Taking? Authorizing Provider  methylPREDNISolone (MEDROL DOSEPAK) 4 MG TBPK tablet Take as per package instructions 05/18/23  Yes Leala Bryand, Janace Aris, MD  traMADol (ULTRAM) 50 MG tablet Take 1 tablet (50 mg total) by mouth every 6 (six) hours as needed (pain). 05/18/23  Yes Zenia Resides, MD  albuterol (VENTOLIN HFA) 108 (90 Base) MCG/ACT inhaler INHALE 2 PUFFS BY MOUTH EVERY 4 HOURS AS NEEDED FOR SHORTNESS OF BREATH OR WHEEZING 02/04/20 11/01/22  Pearson Grippe, MD  ALPRAZolam Prudy Feeler) 0.5 MG tablet Take 1 tablet (0.5 mg total) by mouth daily as needed. 04/04/22     amphetamine-dextroamphetamine (ADDERALL) 5 MG tablet Take 1 tablet by mouth twice a day 06/29/21     amphetamine-dextroamphetamine (ADDERALL) 5 MG  tablet Take 1 tablet (5 mg total) by mouth 2 (two) times daily. 01/31/22     amphetamine-dextroamphetamine (ADDERALL) 5 MG tablet Take 1 tablet (5 mg total) by mouth 2 (two) times daily. 04/08/22     EPINEPHrine (EPIPEN 2-PAK) 0.3 mg/0.3 mL IJ SOAJ injection Use as needed and as directed for allergic reaction 04/19/23   Philip Aspen, Limmie Patricia, MD  esomeprazole (NEXIUM) 20 MG capsule Take 20 mg by mouth daily at 12 noon.    [provider]  irbesartan (AVAPRO) 300 MG tablet Take 1 tablet (300 mg total) by mouth daily. 01/28/22   Lennette Bihari, MD  ketorolac (TORADOL) 10 MG tablet Take 10 mg by mouth every 6 (six) hours as needed.    [provider]  levothyroxine  (SYNTHROID) 50 MCG tablet TAKE ONE TABLET BY MOUTH ON AN EMPTY STOMACH DAILY IN THE MORNING AND AN EXTRA 1/2 TABLET ON SUNDAYS 12/03/19 11/01/22  Pearson Grippe, MD  levothyroxine (SYNTHROID) 50 MCG tablet Take 1 tablet (50 mcg total) by mouth in the morning on an empty stomach, and then take an extra 1/2 tablet (25 mcg total) on Sundays only. 04/06/22     liothyronine (CYTOMEL) 5 MCG tablet TAKE ONE TABLET BY MOUTH TWICE DAILY ON AN EMPTY STOMACH 12/03/19 11/01/22  Pearson Grippe, MD  liothyronine (CYTOMEL) 5 MCG tablet Take 1 tablet (5 mcg total) by mouth 2 (two) times daily before a meal. 02/01/22     liothyronine (CYTOMEL) 5 MCG tablet Take 1 tablet (5 mcg total) by mouth 2 (two) times daily before a meal (on an empty stomach). 04/04/22     loratadine (CLARITIN) 10 MG tablet Take 10 mg by mouth daily.    [provider]  methocarbamol (ROBAXIN) 500 MG tablet Take 1 tablet (500 mg total) by mouth every 8 (eight) hours. 11/12/21     metoprolol succinate (TOPROL-XL) 50 MG 24 hr tablet Take 1 tablet (50 mg total) by mouth daily. Patient not taking: Reported on 11/01/2022 08/14/20   Lennette Bihari, MD  montelukast (SINGULAIR) 10 MG tablet 1 tablet in the evening 07/15/13   [provider]  Multiple Vitamin (MULTIVITAMIN) tablet Take 1 tablet by mouth daily.    [provider]  pantoprazole (PROTONIX) 40 MG tablet TAKE ONE TABLET BY MOUTH ONCE DAILY 12/03/19 11/01/22  Pearson Grippe, MD  Semaglutide-Weight Management (WEGOVY) 0.25 MG/0.5ML SOAJ Inject 0.25 mg into the skin once a week. 08/19/22     Semaglutide-Weight Management (WEGOVY) 0.5 MG/0.5ML SOAJ Inject 0.5 mg into the skin every 7 (seven) days. 08/19/22     Semaglutide-Weight Management (WEGOVY) 1 MG/0.5ML SOAJ Inject 1 mg into the skin once a week. 07/29/22     Semaglutide-Weight Management (WEGOVY) 1.7 MG/0.75ML SOAJ Inject 1.7 mg into the skin once a week. 11/10/21     Semaglutide-Weight Management (WEGOVY) 2.4 MG/0.75ML SOAJ Inject 2.4 mg into  the skin once a week. 03/11/22     sertraline (ZOLOFT) 50 MG tablet Take 1 tablet (50 mg total) by mouth daily. 04/19/23 04/19/23  Henderson Cloud, MD    Family History Family History  Problem Relation Age of Onset   Mitral valve prolapse Mother    Cancer Mother    Fibromyalgia Mother    Arthritis Mother    Hyperlipidemia Mother    Hypertension Father    Heart failure Father    Alcohol abuse Father    Cancer Father    Hearing loss Father    Heart  disease Father    Hyperlipidemia Father    Varicose Veins Father    Hypertension Brother    Alcohol abuse Brother    Drug abuse Brother    Heart disease Brother    Hyperlipidemia Brother    Kidney disease Brother    Obesity Brother    Arthritis Maternal Grandmother    Cancer Maternal Grandmother    Cancer Maternal Aunt     Social History Social History   Tobacco Use   Smoking status: Never   Smokeless tobacco: Never  Substance Use Topics   Alcohol use: No   Drug use: No     Allergies   Acetaminophen, Adhesive [tape], Aspirin, Effexor [venlafaxine], Egg-derived products, Ibuprofen, Mercury, Wellbutrin [bupropion], and Atenolol   Review of Systems Review of Systems   Physical Exam Triage Vital Signs ED Triage Vitals  Encounter Vitals Group     BP 05/18/23 1535 (!) 157/82     Systolic BP Percentile --      Diastolic BP Percentile --      Pulse Rate 05/18/23 1535 87     Resp 05/18/23 1535 16     Temp 05/18/23 1535 99 F (37.2 C)     Temp Source 05/18/23 1535 Oral     SpO2 05/18/23 1535 93 %     Weight --      Height --      Head Circumference --      Peak Flow --      Pain Score 05/18/23 1537 4     Pain Loc --      Pain Education --      Exclude from Growth Chart --    No data found.  Updated Vital Signs BP (!) 157/82 (BP Location: Right Arm)   Pulse 87   Temp 99 F (37.2 C) (Oral)   Resp 16   SpO2 93%   Visual Acuity Right Eye Distance:   Left Eye Distance:   Bilateral Distance:     Right Eye Near:   Left Eye Near:    Bilateral Near:     Physical Exam Vitals reviewed.  Constitutional:      General: She is not in acute distress.    Appearance: She is not ill-appearing, toxic-appearing or diaphoretic.  HENT:     Right Ear: Tympanic membrane, ear canal and external ear normal.     Mouth/Throat:     Mouth: Mucous membranes are moist.  Eyes:     Extraocular Movements: Extraocular movements intact.     Pupils: Pupils are equal, round, and reactive to light.  Musculoskeletal:     Cervical back: Neck supple.     Comments: No effusion of the right knee. No erythema or deformity. ROM nl  Lymphadenopathy:     Cervical: No cervical adenopathy.  Skin:    Coloration: Skin is not jaundiced or pale.  Neurological:     General: No focal deficit present.     Mental Status: She is alert and oriented to person, place, and time.  Psychiatric:        Behavior: Behavior normal.      UC Treatments / Results  Labs (all labs ordered are listed, but only abnormal results are displayed) Labs Reviewed - No data to display  EKG   Radiology No results found.  Procedures Procedures (including critical care time)  Medications Ordered in UC Medications - No data to display  Initial Impression / Assessment and Plan / UC Course  I have reviewed  the triage vital signs and the nursing notes.  Pertinent labs & imaging results that were available during my care of the patient were reviewed by me and considered in my medical decision making (see chart for details).     Knee sleeve brace is applied and Medrol pack and tramadol sent in for inflammation and pain.  Also I am recommending Claritin or Allegra for possible allergic rhinitis and eustachian tube dysfunction.  She will follow-up with her primary care Final Clinical Impressions(s) / UC Diagnoses   Final diagnoses:  Acute pain of right knee  Right ear pain     Discharge Instructions      Take the Medrol  Dosepak as instructed in the package.  Take tramadol 50 mg-- 1 tablet every 6 hours as needed for pain.  This medication can make you sleepy or dizzy  Take Claritin or Allegra as needed for allergies  Please follow-up with your primary about this issue       ED Prescriptions     Medication Sig Dispense Auth. Provider   methylPREDNISolone (MEDROL DOSEPAK) 4 MG TBPK tablet Take as per package instructions 1 each Marlinda Mike, Janace Aris, MD   traMADol (ULTRAM) 50 MG tablet Take 1 tablet (50 mg total) by mouth every 6 (six) hours as needed (pain). 12 tablet Lalo Tromp, Janace Aris, MD      I have reviewed the PDMP during this encounter.   Zenia Resides, MD 05/18/23 [phone]

## 2023-06-13 ENCOUNTER — Other Ambulatory Visit (HOSPITAL_BASED_OUTPATIENT_CLINIC_OR_DEPARTMENT_OTHER): Payer: Self-pay

## 2023-06-13 ENCOUNTER — Other Ambulatory Visit: Payer: Self-pay | Admitting: Internal Medicine

## 2023-06-13 DIAGNOSIS — F330 Major depressive disorder, recurrent, mild: Secondary | ICD-10-CM

## 2023-06-20 ENCOUNTER — Other Ambulatory Visit (HOSPITAL_BASED_OUTPATIENT_CLINIC_OR_DEPARTMENT_OTHER): Payer: Self-pay

## 2023-06-27 ENCOUNTER — Other Ambulatory Visit (HOSPITAL_BASED_OUTPATIENT_CLINIC_OR_DEPARTMENT_OTHER): Payer: Self-pay

## 2023-06-27 DIAGNOSIS — S86911A Strain of unspecified muscle(s) and tendon(s) at lower leg level, right leg, initial encounter: Secondary | ICD-10-CM | POA: Diagnosis not present

## 2023-06-27 DIAGNOSIS — F418 Other specified anxiety disorders: Secondary | ICD-10-CM | POA: Diagnosis not present

## 2023-06-27 MED ORDER — SERTRALINE HCL 50 MG PO TABS
50.0000 mg | ORAL_TABLET | Freq: Every day | ORAL | 0 refills | Status: DC
  Filled 2023-06-27: qty 10, 10d supply, fill #0

## 2023-07-03 ENCOUNTER — Other Ambulatory Visit (HOSPITAL_BASED_OUTPATIENT_CLINIC_OR_DEPARTMENT_OTHER): Payer: Self-pay

## 2023-07-03 DIAGNOSIS — I719 Aortic aneurysm of unspecified site, without rupture: Secondary | ICD-10-CM | POA: Diagnosis not present

## 2023-07-03 DIAGNOSIS — F909 Attention-deficit hyperactivity disorder, unspecified type: Secondary | ICD-10-CM | POA: Diagnosis not present

## 2023-07-03 DIAGNOSIS — I341 Nonrheumatic mitral (valve) prolapse: Secondary | ICD-10-CM | POA: Diagnosis not present

## 2023-07-03 DIAGNOSIS — F419 Anxiety disorder, unspecified: Secondary | ICD-10-CM | POA: Diagnosis not present

## 2023-07-03 DIAGNOSIS — I10 Essential (primary) hypertension: Secondary | ICD-10-CM | POA: Diagnosis not present

## 2023-07-03 DIAGNOSIS — Z8739 Personal history of other diseases of the musculoskeletal system and connective tissue: Secondary | ICD-10-CM | POA: Diagnosis not present

## 2023-07-03 DIAGNOSIS — E039 Hypothyroidism, unspecified: Secondary | ICD-10-CM | POA: Diagnosis not present

## 2023-07-03 MED ORDER — IRBESARTAN 300 MG PO TABS
300.0000 mg | ORAL_TABLET | Freq: Every day | ORAL | 2 refills | Status: AC
  Filled 2023-07-03: qty 90, 90d supply, fill #0
  Filled 2023-11-02: qty 90, 90d supply, fill #1
  Filled 2024-01-29: qty 90, 90d supply, fill #2

## 2023-07-03 MED ORDER — METOPROLOL SUCCINATE ER 25 MG PO TB24
12.5000 mg | ORAL_TABLET | Freq: Every day | ORAL | 3 refills | Status: AC
  Filled 2023-07-03: qty 45, 90d supply, fill #0
  Filled 2023-10-03: qty 45, 90d supply, fill #1
  Filled 2024-01-29: qty 45, 90d supply, fill #2

## 2023-07-03 MED ORDER — METOPROLOL SUCCINATE ER 25 MG PO TB24
25.0000 mg | ORAL_TABLET | Freq: Every day | ORAL | 3 refills | Status: DC
  Filled 2023-07-03: qty 90, 90d supply, fill #0

## 2023-07-03 MED ORDER — SERTRALINE HCL 50 MG PO TABS
50.0000 mg | ORAL_TABLET | Freq: Every day | ORAL | 3 refills | Status: AC
  Filled 2023-07-03 – 2023-07-10 (×2): qty 90, 90d supply, fill #0
  Filled 2023-11-02: qty 90, 90d supply, fill #1
  Filled 2024-01-29: qty 90, 90d supply, fill #2

## 2023-07-04 DIAGNOSIS — E039 Hypothyroidism, unspecified: Secondary | ICD-10-CM | POA: Diagnosis not present

## 2023-07-04 DIAGNOSIS — Z8739 Personal history of other diseases of the musculoskeletal system and connective tissue: Secondary | ICD-10-CM | POA: Diagnosis not present

## 2023-07-04 DIAGNOSIS — I10 Essential (primary) hypertension: Secondary | ICD-10-CM | POA: Diagnosis not present

## 2023-07-10 ENCOUNTER — Other Ambulatory Visit (HOSPITAL_BASED_OUTPATIENT_CLINIC_OR_DEPARTMENT_OTHER): Payer: Self-pay

## 2023-07-10 DIAGNOSIS — I10 Essential (primary) hypertension: Secondary | ICD-10-CM | POA: Diagnosis not present

## 2023-07-26 DIAGNOSIS — I351 Nonrheumatic aortic (valve) insufficiency: Secondary | ICD-10-CM | POA: Diagnosis not present

## 2023-09-06 ENCOUNTER — Other Ambulatory Visit (HOSPITAL_BASED_OUTPATIENT_CLINIC_OR_DEPARTMENT_OTHER): Payer: Self-pay

## 2023-09-06 ENCOUNTER — Encounter (HOSPITAL_BASED_OUTPATIENT_CLINIC_OR_DEPARTMENT_OTHER): Payer: Self-pay

## 2023-10-10 ENCOUNTER — Other Ambulatory Visit (HOSPITAL_BASED_OUTPATIENT_CLINIC_OR_DEPARTMENT_OTHER): Payer: Self-pay

## 2023-10-10 ENCOUNTER — Other Ambulatory Visit: Payer: Self-pay

## 2023-10-10 DIAGNOSIS — E663 Overweight: Secondary | ICD-10-CM | POA: Diagnosis not present

## 2023-10-10 DIAGNOSIS — M1611 Unilateral primary osteoarthritis, right hip: Secondary | ICD-10-CM | POA: Diagnosis not present

## 2023-10-10 DIAGNOSIS — E039 Hypothyroidism, unspecified: Secondary | ICD-10-CM | POA: Diagnosis not present

## 2023-10-10 DIAGNOSIS — M25551 Pain in right hip: Secondary | ICD-10-CM | POA: Diagnosis not present

## 2023-10-10 DIAGNOSIS — R5383 Other fatigue: Secondary | ICD-10-CM | POA: Diagnosis not present

## 2023-10-10 MED ORDER — WEGOVY 0.25 MG/0.5ML ~~LOC~~ SOAJ
SUBCUTANEOUS | 0 refills | Status: AC
  Filled 2023-10-10: qty 2, 28d supply, fill #0

## 2023-10-10 MED ORDER — LIOTHYRONINE SODIUM 5 MCG PO TABS
5.0000 ug | ORAL_TABLET | Freq: Every day | ORAL | 3 refills | Status: AC
  Filled 2023-10-10 – 2023-11-02 (×2): qty 90, 90d supply, fill #0
  Filled 2024-01-29: qty 90, 90d supply, fill #1

## 2023-10-10 MED ORDER — LEVOTHYROXINE SODIUM 50 MCG PO TABS
50.0000 ug | ORAL_TABLET | Freq: Every morning | ORAL | 3 refills | Status: AC
  Filled 2023-10-10 – 2023-11-02 (×3): qty 90, 90d supply, fill #0
  Filled 2024-01-29: qty 90, 90d supply, fill #1

## 2023-10-11 ENCOUNTER — Other Ambulatory Visit (HOSPITAL_BASED_OUTPATIENT_CLINIC_OR_DEPARTMENT_OTHER): Payer: Self-pay

## 2023-10-23 ENCOUNTER — Other Ambulatory Visit (HOSPITAL_BASED_OUTPATIENT_CLINIC_OR_DEPARTMENT_OTHER): Payer: Self-pay

## 2023-11-02 ENCOUNTER — Other Ambulatory Visit: Payer: Self-pay

## 2023-11-02 ENCOUNTER — Other Ambulatory Visit (HOSPITAL_BASED_OUTPATIENT_CLINIC_OR_DEPARTMENT_OTHER): Payer: Self-pay

## 2024-01-11 DIAGNOSIS — M1611 Unilateral primary osteoarthritis, right hip: Secondary | ICD-10-CM | POA: Diagnosis not present

## 2024-01-11 DIAGNOSIS — M25551 Pain in right hip: Secondary | ICD-10-CM | POA: Diagnosis not present

## 2024-02-05 ENCOUNTER — Other Ambulatory Visit (HOSPITAL_BASED_OUTPATIENT_CLINIC_OR_DEPARTMENT_OTHER): Payer: Self-pay

## 2024-02-05 DIAGNOSIS — F419 Anxiety disorder, unspecified: Secondary | ICD-10-CM | POA: Diagnosis not present

## 2024-02-05 DIAGNOSIS — R21 Rash and other nonspecific skin eruption: Secondary | ICD-10-CM | POA: Diagnosis not present

## 2024-02-05 MED ORDER — SERTRALINE HCL 100 MG PO TABS
100.0000 mg | ORAL_TABLET | Freq: Every day | ORAL | 3 refills | Status: AC
  Filled 2024-02-05: qty 90, 90d supply, fill #0

## 2024-02-05 MED ORDER — CLOBETASOL PROPIONATE 0.05 % EX SOLN
CUTANEOUS | 0 refills | Status: AC
  Filled 2024-02-05: qty 50, 30d supply, fill #0

## 2024-02-06 ENCOUNTER — Other Ambulatory Visit (HOSPITAL_BASED_OUTPATIENT_CLINIC_OR_DEPARTMENT_OTHER): Payer: Self-pay

## 2024-02-12 ENCOUNTER — Other Ambulatory Visit (HOSPITAL_COMMUNITY): Payer: Self-pay

## 2024-03-03 ENCOUNTER — Ambulatory Visit (INDEPENDENT_AMBULATORY_CARE_PROVIDER_SITE_OTHER)

## 2024-03-03 ENCOUNTER — Ambulatory Visit
Admission: EM | Admit: 2024-03-03 | Discharge: 2024-03-03 | Disposition: A | Discharge status: Home / Self Care | Attending: Family Medicine | Admitting: Family Medicine

## 2024-03-03 ENCOUNTER — Ambulatory Visit: Payer: Self-pay | Admitting: Urgent Care

## 2024-03-03 DIAGNOSIS — J329 Chronic sinusitis, unspecified: Secondary | ICD-10-CM

## 2024-03-03 DIAGNOSIS — J45990 Exercise induced bronchospasm: Secondary | ICD-10-CM | POA: Diagnosis not present

## 2024-03-03 DIAGNOSIS — R0602 Shortness of breath: Secondary | ICD-10-CM

## 2024-03-03 MED ORDER — PROMETHAZINE-DM 6.25-15 MG/5ML PO SYRP: 5.0000 mL | ORAL_SOLUTION | Freq: Three times a day (TID) | ORAL | 0 refills | Status: AC | PRN

## 2024-03-03 MED ORDER — PREDNISONE 20 MG PO TABS: ORAL_TABLET | ORAL | 0 refills | Status: AC

## 2024-03-03 MED ORDER — ALBUTEROL SULFATE HFA 108 (90 BASE) MCG/ACT IN AERS: 1.0000 | INHALATION_SPRAY | Freq: Four times a day (QID) | RESPIRATORY_TRACT | 0 refills | Status: AC | PRN

## 2024-03-03 MED ORDER — AMOXICILLIN-POT CLAVULANATE 875-125 MG PO TABS: 1.0000 | ORAL_TABLET | Freq: Two times a day (BID) | ORAL | 0 refills | Status: AC

## 2024-03-03 NOTE — Discharge Instructions (Signed)
 Start Augmentin  and prednisone  to help with your sinobronchitis infection. Use albuterol  as needed. Use cough syrup as needed.

## 2024-03-03 NOTE — ED Triage Notes (Signed)
 Pt c/o cough, chest congestion, runny nose-denies fever-sx started 9 days-NAD-steady gait

## 2024-03-03 NOTE — ED Provider Notes (Signed)
 " [facility]  Note:  This document was prepared using Conservation officer, historic buildings and may include unintentional dictation errors.  MRN: [id number] DOB: 11-Dec-1965  Subjective:   Bethany Baker is a 59 y.o. female presenting for 9 day history of persistent and worsening hacking cough, coughing spells, runny and stuffy nose, drainage. Is starting to have chest pain from coughing, wheezing, chest congestion. No fever, ear pain, ear drainage. Has a history of exertional asthma, has previously needed albuterol  inhaler. Has also used Singulair . No smoking of any kind including cigarettes, cigars, vaping, marijuana use.    Current Outpatient Medications  Medication Instructions   albuterol  (VENTOLIN  HFA) 108 (90 Base) MCG/ACT inhaler INHALE 2 PUFFS BY MOUTH EVERY 4 HOURS AS NEEDED FOR SHORTNESS OF BREATH OR WHEEZING   ALPRAZolam  (XANAX ) 0.5 mg, Oral, Daily PRN   amphetamine -dextroamphetamine  (ADDERALL) 5 MG tablet Take 1 tablet by mouth twice a day   amphetamine -dextroamphetamine  (ADDERALL) 5 MG tablet 5 mg, Oral, 2 times daily   amphetamine -dextroamphetamine  (ADDERALL) 5 MG tablet 5 mg, Oral, 2 times daily   clobetasol  (TEMOVATE ) 0.05 % external solution Apply 1 Application topically 2 (two) times a day.   EPINEPHrine  (EPIPEN  2-PAK) 0.3 mg/0.3 mL IJ SOAJ injection Use as needed and as directed for allergic reaction   esomeprazole (NEXIUM) 20 mg, Oral, Daily   irbesartan  (AVAPRO ) 300 mg, Oral, Daily   irbesartan  (AVAPRO ) 300 mg, Oral, Daily   ketorolac  (TORADOL ) 10 mg, Oral, Every 6 hours PRN   levothyroxine  (SYNTHROID ) 50 MCG tablet TAKE ONE TABLET BY MOUTH ON AN EMPTY STOMACH DAILY IN THE MORNING AND AN EXTRA 1/2 TABLET ON SUNDAYS   levothyroxine  (SYNTHROID ) 50 MCG tablet Take 1 tablet (50 mcg total) by mouth in the morning on an empty stomach, and then take an extra 1/2 tablet (25 mcg total) on Sundays only.   levothyroxine  (SYNTHROID ) 50 mcg, Oral, Every morning    liothyronine  (CYTOMEL ) 5 MCG tablet TAKE ONE TABLET BY MOUTH TWICE DAILY ON AN EMPTY STOMACH   liothyronine  (CYTOMEL ) 5 MCG tablet Take 1 tablet (5 mcg total) by mouth 2 (two) times daily before a meal (on an empty stomach).   liothyronine  (CYTOMEL ) 5 mcg, Oral, 2 times daily before meals   liothyronine  (CYTOMEL ) 5 mcg, Oral, Daily   loratadine (CLARITIN) 10 mg, Oral, Daily   methocarbamol  (ROBAXIN ) 500 mg, Oral, Every 8 hours   methylPREDNISolone  (MEDROL  DOSEPAK) 4 MG TBPK tablet Take as directed on pack for 6 days.   metoprolol  succinate (TOPROL -XL) 50 mg, Oral, Daily   metoprolol  succinate (TOPROL -XL) 12.5 mg, Oral, Daily   montelukast  (SINGULAIR ) 10 MG tablet 1 tablet in the evening   Multiple Vitamin (MULTIVITAMIN) tablet 1 tablet, Oral, Daily   pantoprazole  (PROTONIX ) 40 MG tablet TAKE ONE TABLET BY MOUTH ONCE DAILY   semaglutide -weight management (WEGOVY ) 0.25 MG/0.5ML SOAJ SQ injection Inject 0.5 mL (0.25 mg total) under the skin every 7 days.   sertraline  (ZOLOFT ) 50 mg, Oral, Daily   sertraline  (ZOLOFT ) 100 mg, Oral, Daily   traMADol  (ULTRAM ) 50 mg, Oral, Every 6 hours PRN   Wegovy  1.7 mg, Subcutaneous, Weekly   Wegovy  2.4 mg, Subcutaneous, Weekly   Wegovy  1 mg, Subcutaneous, Weekly   Wegovy  0.5 mg, Subcutaneous, Every 7 days   Wegovy  0.25 mg, Subcutaneous, Weekly    Allergies[1]  Past Medical History:  Diagnosis Date   Allergy    Anxiety    Aortic insufficiency    Arthritis  Asthma    Depression    Fibromyalgia    GERD (gastroesophageal reflux disease)    Hyperlipidemia    Hypertension    MR (mitral regurgitation)    MVP (mitral valve prolapse)    OSA (obstructive sleep apnea) 06/10/2019   Sleep apnea    Systolic murmur    Thyroid  disease    HYPOTHYROIDISM     Past Surgical History:  Procedure Laterality Date   CHOLECYSTECTOMY     TRANSESOPHAGEAL ECHOCARDIOGRAM     TUBAL LIGATION      Family History  Problem Relation Age of Onset   Mitral valve  prolapse Mother    Cancer Mother    Fibromyalgia Mother    Arthritis Mother    Hyperlipidemia Mother    Hypertension Father    Heart failure Father    Alcohol abuse Father    Cancer Father    Hearing loss Father    Heart disease Father    Hyperlipidemia Father    Varicose Veins Father    Hypertension Brother    Alcohol abuse Brother    Drug abuse Brother    Heart disease Brother    Hyperlipidemia Brother    Kidney disease Brother    Obesity Brother    Arthritis Maternal Grandmother    Cancer Maternal Grandmother    Cancer Maternal Aunt     Social History   Occupational History   Not on file  Tobacco Use   Smoking status: Never   Smokeless tobacco: Never  Vaping Use   Vaping status: Never Used  Substance and Sexual Activity   Alcohol use: No   Drug use: No   Sexual activity: Not Currently    Birth control/protection: Surgical    Comment: Novasure     ROS   Objective:   Vitals: BP (!) 141/77 (BP Location: Right Arm)   Pulse 77   Temp 99.6 F (37.6 C) (Oral)   Resp 20   SpO2 95%   Physical Exam Constitutional:      General: She is not in acute distress.    Appearance: Normal appearance. She is well-developed and normal weight. She is not ill-appearing, toxic-appearing or diaphoretic.  HENT:     Head: Normocephalic and atraumatic.     Right Ear: Tympanic membrane, ear canal and external ear normal. No drainage or tenderness. No middle ear effusion. There is no impacted cerumen. Tympanic membrane is not erythematous or bulging.     Left Ear: Tympanic membrane, ear canal and external ear normal. No drainage or tenderness.  No middle ear effusion. There is no impacted cerumen. Tympanic membrane is not erythematous or bulging.     Nose: Congestion and rhinorrhea present.     Mouth/Throat:     Mouth: Mucous membranes are moist. No oral lesions.     Pharynx: No pharyngeal swelling, oropharyngeal exudate, posterior oropharyngeal erythema or uvula swelling.      Tonsils: No tonsillar exudate or tonsillar abscesses. 0 on the right. 0 on the left.  Eyes:     General: No scleral icterus.       Right eye: No discharge.        Left eye: No discharge.     Extraocular Movements: Extraocular movements intact.     Right eye: Normal extraocular motion.     Left eye: Normal extraocular motion.     Conjunctiva/sclera: Conjunctivae normal.  Cardiovascular:     Rate and Rhythm: Normal rate and regular rhythm.     Heart sounds:  Normal heart sounds. No murmur heard.    No friction rub. No gallop.  Pulmonary:     Effort: No respiratory distress.     Breath sounds: No stridor. Rhonchi present. No wheezing or rales.     Comments: Extensive coughing throughout the entirety of her visit. Chest:     Chest wall: No tenderness.  Musculoskeletal:     Cervical back: Normal range of motion and neck supple.  Lymphadenopathy:     Cervical: No cervical adenopathy.  Skin:    General: Skin is warm and dry.  Neurological:     General: No focal deficit present.     Mental Status: She is alert and oriented to person, place, and time.  Psychiatric:        Mood and Affect: Mood normal.        Behavior: Behavior normal.     Assessment and Plan :   PDMP not reviewed this encounter.  1. Sinobronchitis   2. Shortness of breath   3. Exertional asthma      X-ray over-read was pending at time of discharge, recommended follow up with only abnormal results. Otherwise will not call for negative over-read. Patient was in agreement.  Recommended Augmentin  and prednisone  for sinobronchitis.  She has not had heart procedures in the past year.  Emphasized the need for strict blood pressure control she has a history of an aneurysm and especially in the context of her need for prednisone  given her respiratory symptoms.  Use supportive care otherwise.  Counseled patient on potential for adverse effects with medications prescribed/recommended today, ER and return-to-clinic precautions  discussed, patient verbalized understanding.     [1]  Allergies Allergen Reactions   Acetaminophen    Adhesive [Tape]     irritation   Aspirin    Effexor [Venlafaxine]     Paranoid when driving   Egg Protein-Containing Drug Products Other (See Comments)   Ibuprofen    Mercury Swelling   Wellbutrin [Bupropion]     Paranoid while driving   Atenolol     Memory loss     Christopher Savannah, [place] 03/03/24 [id number]  "
